# Patient Record
Sex: Male | Born: 1975 | Race: Black or African American | Hispanic: No | Marital: Married | Smoking: Former smoker
Health system: Southern US, Community
[De-identification: ages and names within clinical notes are randomized; demographics above are authoritative.]

## PROBLEM LIST (undated history)

## (undated) DIAGNOSIS — E119 Type 2 diabetes mellitus without complications: Secondary | ICD-10-CM

## (undated) DIAGNOSIS — I10 Essential (primary) hypertension: Secondary | ICD-10-CM

## (undated) DIAGNOSIS — Z973 Presence of spectacles and contact lenses: Secondary | ICD-10-CM

## (undated) DIAGNOSIS — G43909 Migraine, unspecified, not intractable, without status migrainosus: Secondary | ICD-10-CM

## (undated) DIAGNOSIS — E785 Hyperlipidemia, unspecified: Secondary | ICD-10-CM

## (undated) HISTORY — DX: Presence of spectacles and contact lenses: Z97.3

## (undated) HISTORY — DX: Migraine, unspecified, not intractable, without status migrainosus: G43.909

## (undated) HISTORY — DX: Hyperlipidemia, unspecified: E78.5

---

## 2013-01-19 ENCOUNTER — Emergency Department: Payer: Self-pay | Admitting: Emergency Medicine

## 2013-10-13 ENCOUNTER — Emergency Department: Payer: Self-pay | Admitting: Emergency Medicine

## 2013-10-13 LAB — URINALYSIS, COMPLETE
BILIRUBIN, UR: NEGATIVE
BLOOD: NEGATIVE
Bacteria: NONE SEEN
GLUCOSE, UR: NEGATIVE mg/dL (ref 0–75)
Ketone: NEGATIVE
Leukocyte Esterase: NEGATIVE
NITRITE: NEGATIVE
Ph: 6 (ref 4.5–8.0)
Protein: NEGATIVE
RBC,UR: 1 /HPF (ref 0–5)
SQUAMOUS EPITHELIAL: NONE SEEN
Specific Gravity: 1.008 (ref 1.003–1.030)

## 2013-10-13 LAB — GC/CHLAMYDIA PROBE AMP

## 2013-11-20 ENCOUNTER — Ambulatory Visit (INDEPENDENT_AMBULATORY_CARE_PROVIDER_SITE_OTHER): Payer: Managed Care, Other (non HMO) | Admitting: Medical

## 2013-11-20 ENCOUNTER — Encounter: Payer: Self-pay | Admitting: Medical

## 2013-11-20 VITALS — BP 112/80 | HR 80 | Temp 97.8°F | Resp 16 | Ht 71.0 in | Wt 258.0 lb

## 2013-11-20 DIAGNOSIS — Z7251 High risk heterosexual behavior: Secondary | ICD-10-CM

## 2013-11-20 DIAGNOSIS — E669 Obesity, unspecified: Secondary | ICD-10-CM

## 2013-11-20 DIAGNOSIS — R030 Elevated blood-pressure reading, without diagnosis of hypertension: Secondary | ICD-10-CM

## 2013-11-20 NOTE — Progress Notes (Signed)
Subjective Here today as a new patient with concerns of high blood pressure. Went to the emergency department a few weeks ago since a male he was seeing turned up positive with trichomonas, different than his normal girlfriend. At that visit was noted to have elevated blood pressure reading. Since then his current girlfriends mother has checked his blood pressure a few times and down his blood pressure to be as high as 158/100. He has been working to eat healthier less salt less pork. Trying to lose weight. Otherwise has been in normal state of health without complaint.  Review of systems as in subjective   Objective: Filed Vitals:   11/20/13 1152  BP: 112/80  Pulse: 80  Temp: 97.8 F (36.6 C)  Resp: 16    General appearance: alert, no distress, WD/WN, AA male Neck: supple, no lymphadenopathy, no thyromegaly, no masses, no bruits Heart: RRR, normal S1, S2, no murmurs Lungs: CTA bilaterally, no wheezes, rhonchi, or rales Pulses: 2+ symmetric, upper and lower extremities, normal cap refill Ext: no edema   Assessment: Encounter Diagnoses  Name Primary?  . Elevated blood pressure reading without diagnosis of hypertension Yes  . Obesity   . High risk sexual behavior     Plan: BP normal today with my reading and nurse's reading with large cuff.   Discussed diagnosis, possible complications, and treatment of high blood pressure.he will work on diet and exercise changes, weight loss, and recheck in 4 months on weight and blood pressure and for physical  Counseled on safe sex, prevention, although I don't believe he appreciated the conversation.

## 2014-07-06 ENCOUNTER — Emergency Department (HOSPITAL_COMMUNITY)
Admission: EM | Admit: 2014-07-06 | Discharge: 2014-07-06 | Disposition: A | Discharge status: Home / Self Care | Payer: BLUE CROSS/BLUE SHIELD | Attending: Emergency Medicine | Admitting: Emergency Medicine

## 2014-07-06 ENCOUNTER — Encounter (HOSPITAL_COMMUNITY): Payer: Self-pay

## 2014-07-06 DIAGNOSIS — L089 Local infection of the skin and subcutaneous tissue, unspecified: Secondary | ICD-10-CM

## 2014-07-06 DIAGNOSIS — Z720 Tobacco use: Secondary | ICD-10-CM | POA: Diagnosis not present

## 2014-07-06 DIAGNOSIS — L0889 Other specified local infections of the skin and subcutaneous tissue: Secondary | ICD-10-CM | POA: Insufficient documentation

## 2014-07-06 DIAGNOSIS — M25422 Effusion, left elbow: Secondary | ICD-10-CM | POA: Diagnosis present

## 2014-07-06 DIAGNOSIS — R21 Rash and other nonspecific skin eruption: Secondary | ICD-10-CM | POA: Insufficient documentation

## 2014-07-06 DIAGNOSIS — Z8679 Personal history of other diseases of the circulatory system: Secondary | ICD-10-CM | POA: Insufficient documentation

## 2014-07-06 MED ORDER — CEPHALEXIN 500 MG PO CAPS: 500.0000 mg | ORAL_CAPSULE | Freq: Four times a day (QID) | ORAL | Status: DC

## 2014-07-06 MED ORDER — DOXYCYCLINE HYCLATE 100 MG PO CAPS: 100.0000 mg | ORAL_CAPSULE | Freq: Two times a day (BID) | ORAL | Status: DC

## 2014-07-06 NOTE — Discharge Instructions (Signed)

## 2014-07-06 NOTE — ED Provider Notes (Signed)
CSN: [id number]     Arrival date & time 07/06/14  1638 History  This chart was scribed for non-physician provider Alyse Low, PA-C, working with Pamella Pert, MD by Irene Pap, ED Scribe. This patient was seen in room WTR5/WTR5 and patient care was started at 6:09 PM.   Chief Complaint  Patient presents with  . Joint Swelling  . Cellulitis   The history is provided by the patient. No language interpreter was used.    HPI Comments: Eric Mitchell is a 39 y.o. male who presents to the Emergency Department complaining of left elbow swelling and pain onset one day ago. 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 Per nurse's note in Triage, pt rates pain 5/10. He denies knowing if something bit him or scratched him. He denies symptoms like this in the past or allergies.  He denies any known medical problems or use of daily medications.   Past Medical History  Diagnosis Date  . Wears contact lenses   . Migraines    History reviewed. No pertinent past surgical history. History reviewed. No pertinent family history. History  Substance Use Topics  . Smoking status: Current Every Day Smoker -- 0.00 packs/day    Types: Cigarettes  . Smokeless tobacco: Not on file  . Alcohol Use: 0.6 oz/week    1 Cans of beer, 0 Standard drinks or equivalent per week    Review of Systems  Musculoskeletal: Positive for myalgias.  Skin: Positive for rash.       Skin swelling around bump  All other systems reviewed and are negative.   Allergies  Review of patient's allergies indicates no known allergies.  Home Medications   Prior to Admission medications   Not on File   BP 140/102 mmHg  Pulse 98  Temp(Src) 98.3 F (36.8 C) (Oral)  Resp 20  Ht 6' (1.829 m)  Wt 230 lb (104.327 kg)  BMI 31.19 kg/m2  SpO2 97%  Physical Exam  Constitutional: He is oriented to person, place, and time. He appears well-developed and well-nourished. No distress.  HENT:  Head:  Normocephalic and atraumatic.  Eyes: Conjunctivae and EOM are normal.  Neck: Normal range of motion. Neck supple.  Cardiovascular: Normal rate, regular rhythm and normal heart sounds.   Pulmonary/Chest: Effort normal and breath sounds normal.  Musculoskeletal: Normal range of motion. He exhibits no edema.  Neurological: He is alert and oriented to person, place, and time.  Skin: Skin is warm and dry.  14 x 12 cm area of erythema and edema below left posterior elbow.   Psychiatric: He has a normal mood and affect. His behavior is normal.  Nursing note and vitals reviewed.   ED Course  Procedures (including critical care time) DIAGNOSTIC STUDIES: Oxygen Saturation is 97%.    COORDINATION OF CARE: 6:13 PM-Discussed treatment plan which includes anti-biotics and re-check tomorrow with pt at bedside and pt agreed to plan.   Labs Review Labs Reviewed - No data to display  Imaging Review No results found.   EKG Interpretation None      MDM  Area of erythema circled with    Final diagnoses:  Infection of skin    Keflex Doxycycline Return here tomorrow for recheck   I personally performed the services in this documentation, which was scribed in my presence.  The recorded information has been reviewed and considered.   Ronnald Collum.  Hollace [place], PA-C 07/06/14 1827  Pamella Pert, MD  07/06/14 2344 

## 2014-07-06 NOTE — ED Notes (Signed)
Pt c/o L elbow swelling, pain, and possible cellulitis.  Pain score 5/10.  Pt reports a small "bump" yesterday that has increased to the entire posterior elbow.  Swelling and warmth noted.

## 2014-07-09 ENCOUNTER — Ambulatory Visit (INDEPENDENT_AMBULATORY_CARE_PROVIDER_SITE_OTHER): Payer: Worker's Compensation | Admitting: Family Medicine

## 2014-07-09 ENCOUNTER — Encounter: Payer: Self-pay | Admitting: Family Medicine

## 2014-07-09 DIAGNOSIS — M79641 Pain in right hand: Secondary | ICD-10-CM

## 2014-07-09 DIAGNOSIS — S61411A Laceration without foreign body of right hand, initial encounter: Secondary | ICD-10-CM | POA: Diagnosis not present

## 2014-07-09 NOTE — Progress Notes (Signed)
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 The right hand shows a significant laceration over the dorsal aspect of the third and fourth metacarpals. He does feel he can move his fingers normally.   patient is unaware of when he had his last technical shot.   Patient is a coming by his wife. His family consists of 5 children.   Objective: patient's in no acute distress. There were no vitals taken for this visit.   Patient's right hand has a 4 cm dorsal laceration over the metacarpals of the third and fourth fingers. He shows normal range of motion. Gross inspection shows no tendon involvement at the laceration is full-thickness and goes down to the sheath the extensor tendons.  Please see repair noted above. Patient coming back in 7-10 days unless he has increasing pain. Is given a work note for today.  Signed, Carola Frost.D.

## 2014-07-09 NOTE — Progress Notes (Signed)
Procedure: Risk and benefits discussed and verbal consent obtained. The patient was anesthetized using 5 cc of 2% lidocaine with epinephrine.  The wound was scrubbed with soap and water. Sterile prep and drape. The wound was closed with 4-0 Vicryl.  The wound was then cleaned with water and a bandage was applied. Patient instructed to come back for suture removal in 10 days. Philis Fendt, MS, PA-C   12:43 PM, 07/09/2014   Case discussed with Mr. Carlis Abbott who did the repair above.  Signed, Carola Frost.D.

## 2014-07-16 ENCOUNTER — Ambulatory Visit (INDEPENDENT_AMBULATORY_CARE_PROVIDER_SITE_OTHER): Payer: Worker's Compensation | Admitting: Emergency Medicine

## 2014-07-16 VITALS — BP 126/80 | HR 94 | Temp 98.4°F | Resp 18

## 2014-07-16 DIAGNOSIS — S61411D Laceration without foreign body of right hand, subsequent encounter: Secondary | ICD-10-CM

## 2014-07-16 DIAGNOSIS — T8131XA Disruption of external operation (surgical) wound, not elsewhere classified, initial encounter: Secondary | ICD-10-CM

## 2014-07-16 NOTE — Patient Instructions (Signed)

## 2014-07-16 NOTE — Progress Notes (Signed)
Hoyle Crilly Sep 23, 1975 39 y.o.   Chief Complaint  Patient presents with  . Follow-up    rt hand  . Laceration     Date of Injury: 07/09/2014  History of Present Illness:  Presents for evaluation of work-related complaint. He underwent a surgical repair of her laceration by mouth. He says that the intrusive all under tight and fallen out. And is now 1 beginning to separate and his expressed some blood. Denies any evidence of infection fever chills or other complaints he has very little pain   ROS  Review of systems were reviewed and were unremarkable.  No Known Allergies   Current medications reviewed and updated. Past medical history, family history, social history have been reviewed and updated.   Physical Exam  Constitutional: He is oriented to person, place, and time and well-developed, well-nourished, and in no distress.  HENT:  Head: Normocephalic and atraumatic.  Eyes: Pupils are equal, round, and reactive to light.  Neck: Normal range of motion. Neck supple.  Cardiovascular: Normal rate.   Pulmonary/Chest: No respiratory distress.  Abdominal: Soft.  Musculoskeletal: Normal range of motion.  Neurological: He is alert and oriented to person, place, and time.  Skin: Laceration noted.  Psychiatric: Affect normal.     Assessment and Plan:  Dehiscence of a laceration.  The wound was disinfected and then treated with benzoin and Steri-Strips he will follow-up in a week.

## 2014-07-19 ENCOUNTER — Ambulatory Visit (INDEPENDENT_AMBULATORY_CARE_PROVIDER_SITE_OTHER): Payer: Worker's Compensation | Admitting: Emergency Medicine

## 2014-07-19 VITALS — BP 120/78 | HR 91 | Temp 98.2°F | Resp 16 | Ht 71.0 in | Wt 244.5 lb

## 2014-07-19 DIAGNOSIS — S61411D Laceration without foreign body of right hand, subsequent encounter: Secondary | ICD-10-CM | POA: Diagnosis not present

## 2014-07-19 NOTE — Progress Notes (Signed)
Subjective:  Patient ID: Eric Mitchell, male    DOB: 1976-01-17  Age: 39 y.o. MRN: [id number]  CC: Follow-up   HPI Eric Mitchell presents  for suture removal. She had a laceration of Eric Mitchell hand 10 days ago that was complicated by a partial dehiscence of wound with suture unraveling. Eric Mitchell denies any complications now.  History Eric Mitchell has a past medical history of Wears contact lenses and Migraines.   Eric Mitchell has no past surgical history on file.   Eric Mitchell  family history is not on file.  Eric Mitchell   reports that Eric Mitchell has been smoking Cigarettes.  Eric Mitchell has been smoking about 0.00 packs per day. Eric Mitchell does not have any smokeless tobacco history on file. Eric Mitchell reports that Eric Mitchell drinks about 0.6 oz of alcohol per week. Eric Mitchell reports that Eric Mitchell does not use illicit drugs.  Outpatient Prescriptions Prior to Visit  Medication Sig Dispense Refill  . cephALEXin (KEFLEX) 500 MG capsule Take 500 mg by mouth 4 (four) times daily.    Marland Kitchen doxycycline (VIBRA-TABS) 100 MG tablet Take 100 mg by mouth 2 (two) times daily.     No facility-administered medications prior to visit.    History   Social History  . Marital Status: Unknown    Spouse Name: N/A  . Number of Children: N/A  . Years of Education: N/A   Social History Main Topics  . Smoking status: Current Every Day Smoker -- 0.00 packs/day    Types: Cigarettes  . Smokeless tobacco: Not on file  . Alcohol Use: 0.6 oz/week    1 Cans of beer, 0 Standard drinks or equivalent per week  . Drug Use: No  . Sexual Activity: Not on file   Other Topics Concern  . None   Social History Narrative     Review of Systems  Constitutional: Negative for fever, chills and appetite change.  HENT: Negative for congestion, ear pain, postnasal drip, sinus pressure and sore throat.   Eyes: Negative for pain and redness.  Respiratory: Negative for cough, shortness of breath and wheezing.   Cardiovascular: Negative for leg swelling.  Gastrointestinal: Negative for nausea, vomiting,  abdominal pain, diarrhea, constipation and blood in stool.  Endocrine: Negative for polyuria.  Genitourinary: Negative for dysuria, urgency, frequency and flank pain.  Musculoskeletal: Negative for gait problem.  Skin: Negative for rash.  Neurological: Negative for weakness and headaches.  Psychiatric/Behavioral: Negative for confusion and decreased concentration. The patient is not nervous/anxious.     Objective:  BP 120/78 mmHg  Pulse 91  Temp(Src) 98.2 F (36.8 C) (Oral)  Resp 16  Ht 5\' 11"  (1.803 m)  Wt 244 lb 8 oz (110.904 kg)  BMI 34.12 kg/m2  SpO2 98%  Physical Exam  Constitutional: Eric Mitchell is oriented to person, place, and time. Eric Mitchell appears well-developed and well-nourished.  HENT:  Head: Normocephalic and atraumatic.  Eyes: Conjunctivae are normal. Pupils are equal, round, and reactive to light.  Pulmonary/Chest: Effort normal.  Musculoskeletal: Eric Mitchell exhibits no edema.  Neurological: Eric Mitchell is alert and oriented to person, place, and time.  Skin: Skin is dry.  Psychiatric: Eric Mitchell has a normal mood and affect. Eric Mitchell behavior is normal. Thought content normal.      Assessment & Plan:   Eric Mitchell was seen today for follow-up.  Diagnoses and all orders for this visit:  Laceration of hand, right, subsequent encounter   I am having Eric Mitchell maintain Eric Mitchell cephALEXin and doxycycline.  No orders of the defined types were placed in this encounter.  Eric Mitchell sutures were all removed.  Appropriate red flag conditions were discussed with the patient as well as actions that should be taken.  Patient expressed Eric Mitchell understanding.  Follow-up: Return if symptoms worsen or fail to improve.  Roselee Culver, MD

## 2014-11-11 ENCOUNTER — Encounter (HOSPITAL_COMMUNITY): Payer: Self-pay | Admitting: Oncology

## 2014-11-11 ENCOUNTER — Emergency Department (HOSPITAL_COMMUNITY): Payer: BLUE CROSS/BLUE SHIELD

## 2014-11-11 ENCOUNTER — Emergency Department (HOSPITAL_COMMUNITY)
Admission: EM | Admit: 2014-11-11 | Discharge: 2014-11-11 | Disposition: A | Discharge status: Home / Self Care | Payer: BLUE CROSS/BLUE SHIELD | Attending: Emergency Medicine | Admitting: Emergency Medicine

## 2014-11-11 DIAGNOSIS — Y998 Other external cause status: Secondary | ICD-10-CM | POA: Insufficient documentation

## 2014-11-11 DIAGNOSIS — W19XXXA Unspecified fall, initial encounter: Secondary | ICD-10-CM

## 2014-11-11 DIAGNOSIS — Z720 Tobacco use: Secondary | ICD-10-CM | POA: Insufficient documentation

## 2014-11-11 DIAGNOSIS — S20211A Contusion of right front wall of thorax, initial encounter: Secondary | ICD-10-CM | POA: Insufficient documentation

## 2014-11-11 DIAGNOSIS — Y92480 Sidewalk as the place of occurrence of the external cause: Secondary | ICD-10-CM | POA: Insufficient documentation

## 2014-11-11 DIAGNOSIS — Z792 Long term (current) use of antibiotics: Secondary | ICD-10-CM | POA: Insufficient documentation

## 2014-11-11 DIAGNOSIS — Z973 Presence of spectacles and contact lenses: Secondary | ICD-10-CM | POA: Insufficient documentation

## 2014-11-11 DIAGNOSIS — Y9389 Activity, other specified: Secondary | ICD-10-CM | POA: Insufficient documentation

## 2014-11-11 DIAGNOSIS — W010XXA Fall on same level from slipping, tripping and stumbling without subsequent striking against object, initial encounter: Secondary | ICD-10-CM | POA: Insufficient documentation

## 2014-11-11 DIAGNOSIS — Y9289 Other specified places as the place of occurrence of the external cause: Secondary | ICD-10-CM | POA: Insufficient documentation

## 2014-11-11 DIAGNOSIS — Z8679 Personal history of other diseases of the circulatory system: Secondary | ICD-10-CM | POA: Insufficient documentation

## 2014-11-11 MED ORDER — HYDROCODONE-ACETAMINOPHEN 5-325 MG PO TABS
1.0000 | ORAL_TABLET | Freq: Once | ORAL | Status: AC
  Administered 2014-11-11: 1 via ORAL
  Filled 2014-11-11: qty 1

## 2014-11-11 NOTE — ED Provider Notes (Signed)
CSN: [id number]     Arrival date & time 11/11/14  0610 History   First MD Initiated Contact with Patient 11/11/14 [phone]     Chief Complaint  Patient presents with  . Rib Injury     (Consider location/radiation/quality/duration/timing/severity/associated sxs/prior Treatment) HPI Comments: Pt is a 39 yo male who presents to the ED with complaint of right rib pain. Pt reports he slipped in the rain and fell on his right side onto a concrete sidewalk on 10/8. Denies LOC or head injury. Pt reports having worsening intermittent sharp pain to his right lower ribs that is worse with movement of taking a deep breath. Denies fever, chills, HA, sore throat, cough, SOB, wheezing, CP, abdominal pain, N/V/D, urinary sxs, numbness, tingling, weakness. Pt's significant other reports noticing a bruise to the area last week. Pt has not tried taking anything at home for pain relief.    Past Medical History  Diagnosis Date  . Wears contact lenses   . Migraines    History reviewed. No pertinent past surgical history. No family history on file. Social History  Substance Use Topics  . Smoking status: Current Every Day Smoker -- 0.00 packs/day    Types: Cigarettes  . Smokeless tobacco: Never Used  . Alcohol Use: 0.6 oz/week    1 Cans of beer, 0 Standard drinks or equivalent per week    Review of Systems  Cardiovascular: Positive for chest pain.  All other systems reviewed and are negative.     Allergies  Review of patient's allergies indicates no known allergies.  Home Medications   Prior to Admission medications   Medication Sig Start Date End Date Taking? Authorizing Provider  cephALEXin (KEFLEX) 500 MG capsule Take 500 mg by mouth 4 (four) times daily.    Historical Provider, MD  doxycycline (VIBRA-TABS) 100 MG tablet Take 100 mg by mouth 2 (two) times daily.    Historical Provider, MD   BP 144/91 mmHg  Pulse 69  Temp(Src) 97.9 F (36.6 C) (Oral)  Resp 20  Ht 5\' 11"  (1.803 m)  Wt 220  lb (99.791 kg)  BMI 30.70 kg/m2  SpO2 99% Physical Exam  Constitutional: He is oriented to person, place, and time. He appears well-developed and well-nourished. No distress.  HENT:  Head: Normocephalic and atraumatic.  Mouth/Throat: Oropharynx is clear and moist. No oropharyngeal exudate.  Eyes: Conjunctivae and EOM are normal. Right eye exhibits no discharge. Left eye exhibits no discharge. No scleral icterus.  Neck: Normal range of motion. Neck supple.  Cardiovascular: Normal rate, regular rhythm, normal heart sounds and intact distal pulses.   Pulmonary/Chest: Effort normal and breath sounds normal. No respiratory distress. He has no wheezes. He has no rales. He exhibits tenderness.    Abdominal: Soft. Bowel sounds are normal. He exhibits no distension and no mass. There is no tenderness. There is no rebound and no guarding.  Musculoskeletal: Normal range of motion. He exhibits no edema or tenderness.  Neurological: He is alert and oriented to person, place, and time.  Skin: Skin is warm and dry. He is not diaphoretic.  Nursing note and vitals reviewed.   ED Course  Procedures (including critical care time) Labs Review Labs Reviewed - No data to display  Imaging Review No results found. I have personally reviewed and evaluated these images and lab results as part of my medical decision-making.   EKG Interpretation   Date/Time:  Sunday November 11 2014 06:27:50 EDT Ventricular Rate:  69 PR Interval:  140 QRS  Duration: 87 QT Interval:  401 QTC Calculation: 430 R Axis:   59 Text Interpretation:  Sinus rhythm Confirmed by HORTON  MD, COURTNEY  ([id number]) on 11/11/2014 6:30:41 AM     Filed Vitals:   11/11/14 0741  BP: 110/70  Pulse: 62  Temp: 97.9 F (36.6 C)  Resp: 17    MDM   Final diagnoses:  Rib contusion, right, initial encounter  Fall, initial encounter    Pt presents s/p fall that occurred on 10/8 with worsening right lower rib pain. Denies LOC or head  injury. Pain worse with movement/deep breathing. VSS. Exam revealed TTP at right lower anterior ribs, no swelling, no ecchymosis, no abrasion. Cardiac exam benign, lungs CTAB.  EKG shows NSR. CXR shows no acute abnormalities. I suspect sxs are likely due to rib contusion associated with pt's fall. I do not feel that any further workup or imaging is warranted at this time. Plan to d/c pt home. Advised pt to use ibuprofen for pain control. Resource guide given to pt for PCP follow up.  Evaluation does not show pathology requring ongoing emergent intervention or admission. Pt is hemodynamically stable and mentating appropriately. Discussed findings/results and plan with patient/guardian, who agrees with plan. All questions answered. Return precautions discussed and outpatient follow up given.      Chesley Noon [place], [place] 11/11/14 2482  Merryl Hacker, MD 11/11/14 [phone]

## 2014-11-11 NOTE — ED Notes (Signed)
Pt from home had a fall on 10/8 and has had right sided rib/abdominal pain since then that has spread to his epigastric region. Pain is 3/10 while not moving and 10/10 when he moves. Pt states pain is sharp/ Pt has normal heart, lung, and abdominal sounds. Pt has no abdominal tenderness.

## 2014-11-11 NOTE — Discharge Instructions (Signed)
Take Ibuprofen as prescribed over the counter of pain relief. Please follow up with a primary care provider from the Resource Guide provided below in 4-5 days. Please return to the Emergency Department if symptoms worsen or new onset of fever, cough, difficulty breathing.   Emergency Department Resource Guide 1) Find a Doctor and Pay Out of Pocket Although you won't have to find out who is covered by your insurance plan, it is a good idea to ask around and get recommendations. You will then need to call the office and see if the doctor you have chosen will accept you as a new patient and what types of options they offer for patients who are self-pay. Some doctors offer discounts or will set up payment plans for their patients who do not have insurance, but you will need to ask so you aren't surprised when you get to your appointment.  2) Contact Your Local Health Department Not all health departments have doctors that can see patients for sick visits, but many do, so it is worth a call to see if yours does. If you don't know where your local health department is, you can check in your phone book. The CDC also has a tool to help you locate your state's health department, and many state websites also have listings of all of their local health departments.  3) Find a [facility] If your illness is not likely to be very severe or complicated, you may want to try a walk in clinic. These are popping up all over the country in pharmacies, drugstores, and shopping centers. They're usually staffed by nurse practitioners or physician assistants that have been trained to treat common illnesses and complaints. They're usually fairly quick and inexpensive. However, if you have serious medical issues or chronic medical problems, these are probably not your best option.  No Primary Care Doctor: - Call Health Connect at  [phone] - they can help you locate a primary care doctor that  accepts your insurance,  provides certain services, etc. - Physician Referral Service- [phone]  Chronic Pain Problems: Organization         Address  Phone   Notes  [facility]  [phone] Patients need to be referred by their primary care doctor.   Medication Assistance: Organization         Address  Phone   Notes  [facility] Medication [facility] [address] [place], [place] [address] [phone] --Must be a resident of [facility] -- Must have NO insurance coverage whatsoever (no Medicaid/ Medicare, etc.) -- The pt. MUST have a primary care doctor that directs their care regularly and follows them in the community   MedAssist  [phone]   [employer]  [phone]    Agencies that provide inexpensive medical care: Organization         Address  Phone   Notes  [place]  [phone]   [facility]    [phone]   [facility] [place], [place] [address] [phone]   [place] [address] [place] [phone]   Planned Parenthood    [phone]   [facility]    [phone]   [place] and [place], [place] Phone:  [phone], Fax:  [phone] Hours of Operation:  9 am - 6 pm, M-F.  Also accepts Medicaid/Medicare and self-pay.  [facility] for Tabiona [place], [address], [place] Phone: [phone], Fax: [phone]. Hours of Operation:  8:30 am - 5:30 pm, M-F.  Also accepts Medicaid and self-pay.  [facility] [place] [address], [place] Phone: [phone]   [place], [place], [place] [phone], Ext. [phone] Mondays & Thursdays: 7-9 AM.  First 15 patients are seen on a first come, first serve basis.    [place] Providers:  Organization         Address  Phone    Notes  [facility] [address], [place] [phone] Also accepts self-pay patients.  [facility] [address], [place]  [phone]   [place], [address], [place] [phone]   [facility] [address] [place] [phone]   Lucianne Lei [address], [place]   [phone] Only accepts [place] Access [place] patients after they have their name applied to their card.   Self-Pay (no insurance) in [facility]:  Organization         Address  Phone   Notes  Sickle Cell Patients, [facility] [place] [phone]   [facility] [place] [phone]   [facility] [place]  [place], [place], [place] [phone]   [facility]/Dr. Osei-Bonsu  [address] [place] or [address], [place] [phone] Phone number for both [place] and [place] locations is the same.  [facility] [address] [place] [phone]   [facility] [address] [place] or [address] [phone]   [facility] [address] [place] [phone], phone; [phone], fax Sees patients 1st and 3rd Saturday of every month.  Must not qualify for public or private insurance (i.e. Medicaid, Medicare, [place] Health Choice, Veterans' Benefits)  Household income should be no more than 200% of the poverty level The clinic cannot treat you if you are pregnant or think you are pregnant  Sexually transmitted diseases are not treated at the clinic.    Dental Care: Organization         Address  Phone  Notes  [facility] [place] [place] [phone] Accepts children up to age 48 who are enrolled in [place] or [place]; pregnant women with a Medicaid card; and children who have applied for Medicaid or [place] Health Choice, but were declined, whose parents can pay a reduced fee at time of service.  [facility]  [address], [place] [phone] Accepts children up to age 30 who are enrolled in [place] or [place]; pregnant women with a Medicaid card; and children who have applied for Medicaid or [place] Health Choice, but were declined, whose parents can pay a reduced fee at time of service.  [facility] PRO[facility]  [place] [phone] Patients are seen by appointment only. Walk-ins are not accepted. [place] will see patients 85 years of age and older. Monday - Tuesday (  8am-5pm) Most Wednesdays (8:30-5pm) $30 per visit, cash only  [facility] Adult Dental Access PROGRAM  [address], [facility] [phone] Patients are seen by appointment only. Walk-ins are not accepted. [place]iona will see patients 76 years of age and older. One Wednesday Evening (Monthly: Volunteer Based).  $30 per visit, cash only  [place]  [phone] for adults; Children under age 85, call Graduate Pediatric Dentistry at [phone]. Children aged 73-14, please call [phone] to request a pediatric application.  Dental services are provided in all areas of dental care including fillings, crowns and bridges, complete and partial dentures, implants, gum treatment, root canals, and extractions. Preventive care is also provided. Treatment is provided to both adults and children. Patients are selected via a lottery and there is often a waiting list.   [facility] [address], [place]  [phone] [email or web address]   [facility] [address] [place], [place] [phone], Ext.  123 Second and Fourth Thursday of each month, opens at 6:30 AM; Clinic ends at 9 AM.  Patients are seen on a first-come first-served basis, and a limited number are seen during each clinic.   [facility]  [address] [place]ard D[place]er [place], [place] [phone]   Eligibility Requirements You must have lived in [place], [place], or [place] counties for at least the last three months.   You cannot be eligible for state or federal sponsored [employer], including [employer], [place], or [employer].   You generally cannot be eligible for healthcare insurance through your employer.    How to apply: Eligibility screenings are held every Tuesday and Wednesday afternoon from 1:00 pm until 4:00 pm. You do not need an appointment for the interview!  [facility] [address], [place], [place]   [place]  [place] Department  [facility]  [phone]    Behavioral Health Resources in the Community: Intensive Outpatient Programs Organization         Address  Phone  Notes  [place]. [address], [place], [place] [phone]   [facility] Outpatient [address] [place], [place]   ADS: Alcohol & Drug Svcs [address] [place], [place]   [place] [address]  [place], [place] or [phone]   Substance Abuse Resources Organization         Address  Phone  Notes  Alcohol and Drug Services  [phone]   [place]  [phone]   [facility] [place]   Chinita Pester  [phone]   Residential & Outpatient Substance Abuse Program  [phone]   Psychological Services Organization         Address  Phone  Notes  [facility] [place]  [place]  [phone]   [place] [address], [place] or [phone]    Mobile Crisis Teams Organization         Address  Phone  Notes  [facility], Mobile Crisis Care Unit  [phone]   [facility] Psychotherapeutic Services  [address] [place], [place]   [place] Levels [address] [place] [phone]    Self-Help/Support Groups Organization         Address  Phone  Notes  [facility]ental [facility]oc[facility] of [place] - variety of support groups  [place] Call for more information  Narcotics Anonymous (NA), [facility] [address], [employer] [place]  2 meetings at this location   Special educational needs teacher         Address  Phone  Notes  [facility] [place],    [place]  [phone]   [facility]  [address], [place] [phone], [place], [place]   [place], [place] [phone] Admissions: 8am-3pm M-F  Incentives Substance [place] [address],    [place], [place] [phone]   [facility] [address] [place], [place], [place]   [facility] [address],  [place], [place]   [facility] Programs - Intensive Outpatient [address]een [address], [place], [place]   [facility] ([place] [address].,  [place], [place] [phone] or [phone]   [facility]idential Treatment Services (RTS) [address] [place], [place] Accepts Medicaid  [facility] [place] [address]  [place] [phone] Substance Abuse/Addiction Treatment   [facility] Organization         Address  Phone  Notes  [facility]  [phone]   Domenic Schwab, PhD [address] Arlis [place], [place]   [phone] or [phone]   [place]  [place], [place] [phone]   [facility] [address] [place], [place] [phone] Insurance/Medicaid/sponsorship through [facility] and Families [address], [address] [place]                                    [place], [place] [phone] [place] [address] [address], [place] [phone]    Dr. Adele Schilder  [phone]   [facility] Dept. 1) [address] [place] 2) [place] 3)  [address], [place] [phone]  [phone]   [place] [phone] or [phone] (After Hours)

## 2014-11-11 NOTE — ED Notes (Signed)
Pt to xray

## 2014-11-11 NOTE — ED Notes (Signed)
Pt presents w/ c/o epigastric and chest pain.  Pt rates pain 10/10 sharp in nature.  Pt fell last week onto his right side on cement this is when the pain began and has gotten progressively worse.

## 2015-03-15 ENCOUNTER — Emergency Department (HOSPITAL_COMMUNITY)
Admission: EM | Admit: 2015-03-15 | Discharge: 2015-03-15 | Disposition: A | Discharge status: Home / Self Care | Payer: BLUE CROSS/BLUE SHIELD | Attending: Emergency Medicine | Admitting: Emergency Medicine

## 2015-03-15 ENCOUNTER — Encounter (HOSPITAL_COMMUNITY): Payer: Self-pay | Admitting: Emergency Medicine

## 2015-03-15 DIAGNOSIS — H1012 Acute atopic conjunctivitis, left eye: Secondary | ICD-10-CM | POA: Insufficient documentation

## 2015-03-15 DIAGNOSIS — Z8679 Personal history of other diseases of the circulatory system: Secondary | ICD-10-CM | POA: Diagnosis not present

## 2015-03-15 DIAGNOSIS — F17210 Nicotine dependence, cigarettes, uncomplicated: Secondary | ICD-10-CM | POA: Insufficient documentation

## 2015-03-15 DIAGNOSIS — H5712 Ocular pain, left eye: Secondary | ICD-10-CM | POA: Diagnosis present

## 2015-03-15 DIAGNOSIS — H11422 Conjunctival edema, left eye: Secondary | ICD-10-CM | POA: Insufficient documentation

## 2015-03-15 MED ORDER — FLUORESCEIN SODIUM 1 MG OP STRP
1.0000 | ORAL_STRIP | Freq: Once | OPHTHALMIC | Status: AC
  Administered 2015-03-15: 1 via OPHTHALMIC
  Filled 2015-03-15: qty 1

## 2015-03-15 MED ORDER — NAPHAZOLINE-PHENIRAMINE 0.025-0.3 % OP SOLN: 1.0000 [drp] | Freq: Four times a day (QID) | OPHTHALMIC | Status: DC | PRN

## 2015-03-15 MED ORDER — TETRACAINE HCL 0.5 % OP SOLN
2.0000 [drp] | Freq: Once | OPHTHALMIC | Status: AC
  Administered 2015-03-15: 2 [drp] via OPHTHALMIC
  Filled 2015-03-15: qty 4

## 2015-03-15 NOTE — Discharge Instructions (Signed)
Take your medications as prescribed. Refrain from using your contacts for the next few days until your symptoms have resolved. Follow up with your Eye doctor in the next 1-2 days as needed. Please return to the Emergency Department if symptoms worsen or new onset of fever, redness, swelling, drainage, visual changes, sensitivity to light.

## 2015-03-15 NOTE — ED Notes (Signed)
Pt states he woke up this morning with his left eye feeling irritated and draining  Pt states it itches and has been draining

## 2015-03-15 NOTE — ED Provider Notes (Signed)
CSN: [id number]     Arrival date & time 03/15/15  0531 History   First MD Initiated Contact with Patient 03/15/15 0615     Chief Complaint  Patient presents with  . Conjunctivitis     (Consider location/radiation/quality/duration/timing/severity/associated sxs/prior Treatment) HPI   Pt is a 40 yo male with no PMH who presents to the ED with complaint of left eye pain. Pt reports when he woke up this morning he noticed he was having watering to his left eye with associated itching/scratching pain. Endorses wearing contacts. Denies fever, redness, swelling, visual changes, photophobia. Denies any recent eye injury/trauma. Denies using any medications PTA.  Past Medical History  Diagnosis Date  . Wears contact lenses   . Migraines    History reviewed. No pertinent past surgical history. History reviewed. No pertinent family history. Social History  Substance Use Topics  . Smoking status: Current Every Day Smoker -- 0.00 packs/day    Types: Cigarettes  . Smokeless tobacco: Never Used  . Alcohol Use: No    Review of Systems  Constitutional: Negative for fever.  HENT: Negative for congestion, ear pain and rhinorrhea.   Eyes: Positive for discharge (watery) and itching. Negative for photophobia, redness and visual disturbance.  Neurological: Negative for headaches.      Allergies  Review of patient's allergies indicates no known allergies.  Home Medications   Prior to Admission medications   Medication Sig Start Date End Date Taking? Authorizing Provider  naphazoline-pheniramine (NAPHCON-A) 0.025-0.3 % ophthalmic solution Place 1 drop into the left eye 4 (four) times daily as needed for irritation. 03/15/15   Chesley Noon Raahil Ong, PA-C   BP 149/95 mmHg  Pulse 81  Temp(Src) 97.9 F (36.6 C) (Oral)  Resp 16  SpO2 96% Physical Exam  Constitutional: He is oriented to person, place, and time. He appears well-developed and well-nourished.  HENT:  Head: Normocephalic and  atraumatic.  Eyes: Conjunctivae, EOM and lids are normal. Pupils are equal, round, and reactive to light. Lids are everted and swept, no foreign bodies found. Right eye exhibits no chemosis, no discharge, no exudate and no hordeolum. No foreign body present in the right eye. Left eye exhibits chemosis. Left eye exhibits no discharge, no exudate and no hordeolum. No foreign body present in the left eye. Right conjunctiva is not injected. Left conjunctiva is not injected. No scleral icterus.  Slit lamp exam:      The left eye shows no corneal abrasion, no corneal flare, no corneal ulcer, no foreign body, no hyphema and no fluorescein uptake.  Neck: Normal range of motion. Neck supple.  Cardiovascular: Normal rate.   Pulmonary/Chest: Effort normal.  Musculoskeletal: Normal range of motion.  Neurological: He is alert and oriented to person, place, and time.  Skin: Skin is warm and dry.  Nursing note and vitals reviewed.   ED Course  Procedures (including critical care time) Labs Review Labs Reviewed - No data to display  Imaging Review No results found. I have personally reviewed and evaluated these images and lab results as part of my medical decision-making.   EKG Interpretation None      MDM   Final diagnoses:  Allergic conjunctivitis, left    Pt presents with left eye watering and itching. Exam revealed watering of left eye with mild chemosis, remaining exam unremarkable. Visual acuity unremarkable. No fluorescein uptake. Sxs consistent with allergic conjunctivitis. Will d/c pt home with naphazoline eye drops and advised pt to follow up with his ophthalmologist.   Evaluation  does not show pathology requring ongoing emergent intervention or admission. Pt is hemodynamically stable and mentating appropriately. Discussed findings/results and plan with patient/guardian, who agrees with plan. All questions answered. Return precautions discussed and outpatient follow up given.       Chesley Noon [place], [place] 03/15/15 [id number]  Virgel Manifold, MD 03/21/15 [phone]

## 2016-07-28 ENCOUNTER — Encounter (HOSPITAL_BASED_OUTPATIENT_CLINIC_OR_DEPARTMENT_OTHER): Payer: Self-pay

## 2016-07-28 ENCOUNTER — Emergency Department (HOSPITAL_BASED_OUTPATIENT_CLINIC_OR_DEPARTMENT_OTHER)
Admission: EM | Admit: 2016-07-28 | Discharge: 2016-07-28 | Disposition: A | Discharge status: Home / Self Care | Payer: Managed Care, Other (non HMO) | Attending: Emergency Medicine | Admitting: Emergency Medicine

## 2016-07-28 DIAGNOSIS — Z5321 Procedure and treatment not carried out due to patient leaving prior to being seen by health care provider: Secondary | ICD-10-CM | POA: Diagnosis not present

## 2016-07-28 DIAGNOSIS — W57XXXA Bitten or stung by nonvenomous insect and other nonvenomous arthropods, initial encounter: Secondary | ICD-10-CM | POA: Insufficient documentation

## 2016-07-28 DIAGNOSIS — R21 Rash and other nonspecific skin eruption: Secondary | ICD-10-CM | POA: Diagnosis not present

## 2016-07-28 NOTE — ED Notes (Signed)
Pt was not in room when PA went to see pt.

## 2016-07-28 NOTE — ED Notes (Signed)
ED Provider at bedside. 

## 2016-07-28 NOTE — ED Provider Notes (Cosign Needed)
Patient left before evaluation.    Eric Mitchell, [place] 07/28/16 2238

## 2016-07-28 NOTE — ED Notes (Signed)
Pt not found in room when PA went to see pt.  Not found in ED or WR.

## 2016-07-28 NOTE — ED Triage Notes (Signed)
C/o spider bite to right lower abd x 15 min PTA-NAD-steady gait

## 2018-09-16 ENCOUNTER — Encounter: Payer: Self-pay | Admitting: Emergency Medicine

## 2018-09-16 ENCOUNTER — Other Ambulatory Visit: Payer: Self-pay

## 2018-09-16 ENCOUNTER — Emergency Department: Payer: Managed Care, Other (non HMO)

## 2018-09-16 ENCOUNTER — Emergency Department
Admission: EM | Admit: 2018-09-16 | Discharge: 2018-09-16 | Disposition: A | Discharge status: Home / Self Care | Payer: Managed Care, Other (non HMO) | Attending: Emergency Medicine | Admitting: Emergency Medicine

## 2018-09-16 ENCOUNTER — Other Ambulatory Visit
Admission: RE | Admit: 2018-09-16 | Discharge: 2018-09-16 | Disposition: A | Discharge status: Home / Self Care | Payer: Managed Care, Other (non HMO) | Source: Ambulatory Visit | Attending: Emergency Medicine | Admitting: Emergency Medicine

## 2018-09-16 DIAGNOSIS — N202 Calculus of kidney with calculus of ureter: Secondary | ICD-10-CM | POA: Diagnosis not present

## 2018-09-16 DIAGNOSIS — F17210 Nicotine dependence, cigarettes, uncomplicated: Secondary | ICD-10-CM | POA: Diagnosis not present

## 2018-09-16 DIAGNOSIS — I10 Essential (primary) hypertension: Secondary | ICD-10-CM | POA: Diagnosis not present

## 2018-09-16 DIAGNOSIS — N200 Calculus of kidney: Secondary | ICD-10-CM

## 2018-09-16 DIAGNOSIS — R1031 Right lower quadrant pain: Secondary | ICD-10-CM | POA: Diagnosis present

## 2018-09-16 HISTORY — DX: Essential (primary) hypertension: I10

## 2018-09-16 LAB — COMPREHENSIVE METABOLIC PANEL
ALT: 42 U/L (ref 0–44)
AST: 24 U/L (ref 15–41)
Albumin: 4.4 g/dL (ref 3.5–5.0)
Alkaline Phosphatase: 68 U/L (ref 38–126)
Anion gap: 11 (ref 5–15)
BUN: 15 mg/dL (ref 6–20)
CO2: 24 mmol/L (ref 22–32)
Calcium: 9.2 mg/dL (ref 8.9–10.3)
Chloride: 105 mmol/L (ref 98–111)
Creatinine, Ser: 1.09 mg/dL (ref 0.61–1.24)
GFR calc Af Amer: >60 mL/min (ref >60)
GFR calc non Af Amer: >60 mL/min (ref >60)
Glucose, Bld: 169 mg/dL — ABNORMAL HIGH (ref 70–99)
Potassium: 4 mmol/L (ref 3.5–5.1)
Sodium: 140 mmol/L (ref 135–145)
Total Bilirubin: 0.6 mg/dL (ref 0.3–1.2)
Total Protein: 7.4 g/dL (ref 6.5–8.1)

## 2018-09-16 LAB — URINALYSIS, COMPLETE (UACMP) WITH MICROSCOPIC
Bacteria, UA: NONE SEEN
Bilirubin Urine: NEGATIVE
Glucose, UA: >=500 mg/dL — AB
Ketones, ur: NEGATIVE mg/dL
Leukocytes,Ua: NEGATIVE
Nitrite: NEGATIVE
Protein, ur: NEGATIVE mg/dL
RBC / HPF: >50 RBC/hpf — ABNORMAL HIGH (ref 0–5)
Specific Gravity, Urine: 1.018 (ref 1.005–1.030)
Squamous Epithelial / LPF: NONE SEEN (ref 0–5)
pH: 5 (ref 5.0–8.0)

## 2018-09-16 LAB — CBC
HCT: 46.1 % (ref 39.0–52.0)
Hemoglobin: 15.9 g/dL (ref 13.0–17.0)
MCH: 31.5 pg (ref 26.0–34.0)
MCHC: 34.5 g/dL (ref 30.0–36.0)
MCV: 91.3 fL (ref 80.0–100.0)
Platelets: 280 10*3/uL (ref 150–400)
RBC: 5.05 MIL/uL (ref 4.22–5.81)
RDW: 12.3 % (ref 11.5–15.5)
WBC: 5.3 10*3/uL (ref 4.0–10.5)
nRBC: 0 % (ref 0.0–0.2)

## 2018-09-16 LAB — LIPASE, BLOOD: Lipase: 52 U/L — ABNORMAL HIGH (ref 11–51)

## 2018-09-16 MED ORDER — ONDANSETRON HCL 4 MG/2ML IJ SOLN
INTRAMUSCULAR | Status: AC
  Administered 2018-09-16: 4 mg via INTRAVENOUS
  Filled 2018-09-16: qty 2

## 2018-09-16 MED ORDER — TAMSULOSIN HCL 0.4 MG PO CAPS: 0.4000 mg | ORAL_CAPSULE | Freq: Every day | ORAL | 0 refills | Status: AC

## 2018-09-16 MED ORDER — MORPHINE SULFATE (PF) 4 MG/ML IV SOLN
4.0000 mg | Freq: Once | INTRAVENOUS | Status: AC
  Administered 2018-09-16: 06:00:00 4 mg via INTRAVENOUS

## 2018-09-16 MED ORDER — SODIUM CHLORIDE 0.9 % IV BOLUS
1000.0000 mL | Freq: Once | INTRAVENOUS | Status: AC
  Administered 2018-09-16: 1000 mL via INTRAVENOUS

## 2018-09-16 MED ORDER — MORPHINE SULFATE (PF) 4 MG/ML IV SOLN
INTRAVENOUS | Status: AC
  Administered 2018-09-16: 4 mg via INTRAVENOUS
  Filled 2018-09-16: qty 1

## 2018-09-16 MED ORDER — OXYCODONE-ACETAMINOPHEN 5-325 MG PO TABS: 1.0000 | ORAL_TABLET | ORAL | 0 refills | Status: AC | PRN

## 2018-09-16 MED ORDER — HYDROMORPHONE HCL 1 MG/ML IJ SOLN
1.0000 mg | Freq: Once | INTRAMUSCULAR | Status: AC
  Administered 2018-09-16: 1 mg via INTRAVENOUS
  Filled 2018-09-16: qty 1

## 2018-09-16 MED ORDER — ONDANSETRON HCL 4 MG/2ML IJ SOLN
4.0000 mg | Freq: Once | INTRAMUSCULAR | Status: AC
  Administered 2018-09-16: 06:00:00 4 mg via INTRAVENOUS

## 2018-09-16 MED ORDER — KETOROLAC TROMETHAMINE 30 MG/ML IJ SOLN
30.0000 mg | Freq: Once | INTRAMUSCULAR | Status: AC
  Administered 2018-09-16: 30 mg via INTRAVENOUS
  Filled 2018-09-16: qty 1

## 2018-09-16 MED ORDER — ONDANSETRON 4 MG PO TBDP: 4.0000 mg | ORAL_TABLET | Freq: Three times a day (TID) | ORAL | 0 refills | Status: DC | PRN

## 2018-09-16 MED ORDER — SODIUM CHLORIDE 0.9% FLUSH: 3.0000 mL | Freq: Once | INTRAVENOUS | Status: DC

## 2018-09-16 NOTE — ED Notes (Signed)
Pt returned to ED Rm 12 from CT at this time.

## 2018-09-16 NOTE — ED Provider Notes (Addendum)
Patient had been signed out initially as a likely renal stone, with urinalysis pending. On my assumption of care at 7 AM, however, pt had been discharged prior to receiving UA results. I was unable to assess the pt. I've asked nursing to contact the pt to notify him to return for UA. Risks of undiagnosed infection including sepsis, death discussed. See Dr. Saul Fordyce note for details.  ADDENDUM: I was unable to directly reach pt but RN was able. See her note. Pt is aware of need for urine and risk of possible infection. Unlikely based on labs but he understands cannot be diagnosed w/o urine. Advised to return and precautions given.   Duffy Bruce, MD 09/16/18 [phone]

## 2018-09-16 NOTE — ED Notes (Signed)
Post discharge discussed patient with Dr. Ellender Hose.  Patient was discharged prior to getting a needed urine sample.  Patient called by this RN and requested to come back to the hospital to give a urine sample.  Patient states he does not have a ride at this time but will try to come to the ED later.

## 2018-09-16 NOTE — ED Triage Notes (Signed)
Pt presents to ED with right lower abd pain that started approx 30 min ago. Pt states he had similar pain about 2 weeks but symptoms resolved and pt was never evaluated for the same. +nausea. Pt states he voided prior to arrival; blood noted in urine.

## 2018-09-16 NOTE — ED Provider Notes (Signed)
[facility] Emergency Department Provider Note    First MD Initiated Contact with Patient 09/16/18 [phone]     (approximate)  I have reviewed the triage vital signs and the nursing notes.   HISTORY  Chief Complaint Abdominal Pain    HPI Eric Mitchell is a 43 y.o. male with below listed previous medical conditions presents to the emergency department secondary to 10 out of 10 right lower quadrant/right flank pain which patient states began 30 minutes before arrival.  Patient states he had a similar episode 2 weeks ago that spontaneously resolved.  Patient also admits to hematuria before arrival to the emergency department tonight.  Patient also admits to nausea however no vomiting.  Patient denies any diarrhea constipation.  Patient denies any fever        Past Medical History:  Diagnosis Date   Hypertension    Migraines    Wears contact lenses     There are no active problems to display for this patient.   History reviewed. No pertinent surgical history.  Prior to Admission medications   Medication Sig Start Date End Date Taking? Authorizing Provider  ondansetron (ZOFRAN ODT) 4 MG disintegrating tablet Take 1 tablet (4 mg total) by mouth every 8 (eight) hours as needed. 09/16/18   Gregor Hams, MD  oxyCODONE-acetaminophen (PERCOCET) 5-325 MG tablet Take 1 tablet by mouth every 4 (four) hours as needed. 09/16/18 09/16/19  Gregor Hams, MD  tamsulosin (FLOMAX) 0.4 MG CAPS capsule Take 1 capsule (0.4 mg total) by mouth daily after breakfast for 14 days. 09/16/18 09/30/18  Gregor Hams, MD    Allergies Patient has no known allergies.  No family history on file.  Social History Social History   Tobacco Use   Smoking status: Current Every Day Smoker    Packs/day: 0.00    Types: Cigarettes   Smokeless tobacco: Never Used  Substance Use Topics   Alcohol use: No   Drug use: No    Review of Systems Constitutional: No  fever/chills Eyes: No visual changes. ENT: No sore throat. Cardiovascular: Denies chest pain. Respiratory: Denies shortness of breath. Gastrointestinal: Positive for right flank/right lower quadrant abdominal pain.  No nausea, no vomiting.  No diarrhea.  No constipation. Genitourinary: Negative for dysuria. Musculoskeletal: Negative for neck pain.  Negative for back pain. Integumentary: Negative for rash. Neurological: Negative for headaches, focal weakness or numbness.  ____________________________________________   PHYSICAL EXAM:  VITAL SIGNS: ED Triage Vitals  Enc Vitals Group     BP 09/16/18 0550 (!) 149/93     Pulse Rate 09/16/18 0550 80     Resp 09/16/18 0550 (!) 22     Temp 09/16/18 0550 (!) 97.1 F (36.2 C)     Temp Source 09/16/18 0550 Oral     SpO2 09/16/18 0550 97 %     Weight 09/16/18 0545 108.9 kg (240 lb)     Height 09/16/18 0545 1.803 m (5\' 11" )     Head Circumference --      Peak Flow --      Pain Score 09/16/18 0545 10     Pain Loc --      Pain Edu? --      Excl. in [place]? --     Constitutional: Alert and oriented.  Apparent discomfort Eyes: Conjunctivae are normal.  Mouth/Throat: Mucous membranes are moist. Neck: No stridor.  No meningeal signs.   Cardiovascular: Normal rate, regular rhythm. Good peripheral circulation. Grossly normal heart sounds. Respiratory:  Normal respiratory effort.  No retractions. Gastrointestinal: Soft and nontender. No distention.  Musculoskeletal: No lower extremity tenderness nor edema. No gross deformities of extremities. Neurologic:  Normal speech and language. No gross focal neurologic deficits are appreciated.  Skin:  Skin is warm, dry and intact. Psychiatric: Mood and affect are normal. Speech and behavior are normal.  ____________________________________________   LABS (all labs ordered are listed, but only abnormal results are displayed)  Labs Reviewed  LIPASE, BLOOD - Abnormal; Notable for the following  components:      Result Value   Lipase 52 (*)    All other components within normal limits  COMPREHENSIVE METABOLIC PANEL - Abnormal; Notable for the following components:   Glucose, Bld 169 (*)    All other components within normal limits  CBC  URINALYSIS, COMPLETE (UACMP) WITH MICROSCOPIC   ____________________________________  RADIOLOGY I, Hoberg N Deborrah Mabin, personally viewed and evaluated these images (plain radiographs) as part of my medical decision making, as well as reviewing the written report by the radiologist.  ED MD interpretation: Obstructing 3 mm distal right urethral calculus per radiologist..    Official radiology report(s): Ct Renal Stone Study  Result Date: 09/16/2018 CLINICAL DATA:  Flank pain with stone disease suspected on the right EXAM: CT ABDOMEN AND PELVIS WITHOUT CONTRAST TECHNIQUE: Multidetector CT imaging of the abdomen and pelvis was performed following the standard protocol without IV contrast. COMPARISON:  None. FINDINGS: Lower chest:  No contributory findings. Hepatobiliary: Hepatic steatosis.No evidence of biliary obstruction or stone. Pancreas: Unremarkable. Spleen: Unremarkable. Adrenals/Urinary Tract: Negative adrenals. Right hydronephrosis and low-density renal expansion secondary to a 3 mm stone between the iliac crossing and the UVJ . there is a 6 mm right lower pole calculus. 2 mm left lower pole calculus. Stomach/Bowel:  No obstruction. No evidence of bowel inflammation. Vascular/Lymphatic: No acute vascular abnormality. No mass or adenopathy. Reproductive:No pathologic findings. Other: No ascites or pneumoperitoneum. Musculoskeletal: No acute abnormalities. 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 IMPRESSION: 1. Obstructing 3 mm distal right ureteral calculus. 2. Bilateral renal calculi measuring up to 6 mm on the right. 3. Hepatic steatosis. Electronically Signed   By: Monte Fantasia M.D.   On: 09/16/2018 06:46       Procedures   ____________________________________________   INITIAL IMPRESSION / MDM / ASSESSMENT AND PLAN / ED COURSE  As part of my medical decision making, I reviewed the following data within the electronic MEDICAL RECORD NUMBER   43 year old male presented with above-stated history and physical exam consistent with right ureterolithiasis which was confirmed on CT.  Patient given IV morphine 4 mg with minimal improvement of pain and as such 1 mg of IV Dilaudid was given.  After reviewing CT scan and laboratory findings patient given Toradol 30 mg.  On reevaluation patient is pain-free at present.  Awaiting urinalysis with anticipation of discharge if no evidence of infection.  Patient's care transferred to Dr. Ellender Hose.  ____________________________________________  FINAL CLINICAL IMPRESSION(S) / ED DIAGNOSES  Final diagnoses:  Right kidney stone     MEDICATIONS GIVEN DURING THIS VISIT:  Medications  sodium chloride flush (NS) 0.9 % injection 3 mL (has no administration in time range)  morphine 4 MG/ML injection 4 mg (4 mg Intravenous Given 09/16/18 0557)  ondansetron (ZOFRAN) injection 4 mg (4 mg Intravenous Given 09/16/18 0557)  sodium chloride 0.9 % bolus 1,000 mL (0 mLs Intravenous Stopped 09/16/18 0711)  ketorolac (TORADOL) 30 MG/ML injection 30 mg (30 mg Intravenous Given  09/16/18 [id number])  HYDROmorphone (DILAUDID) injection 1 mg (1 mg Intravenous Given 09/16/18 [id number])     ED Discharge Orders         Ordered    oxyCODONE-acetaminophen (PERCOCET) 5-325 MG tablet  Every 4 hours PRN     09/16/18 0655    tamsulosin (FLOMAX) 0.4 MG CAPS capsule  Daily after breakfast     09/16/18 0655    ondansetron (ZOFRAN ODT) 4 MG disintegrating tablet  Every 8 hours PRN     09/16/18 [id number]          *Please note:  Mcarthur Bernasconi was evaluated in Emergency Department on 09/16/2018 for the symptoms described in the history of present illness. He was evaluated in the context of the global  COVID-19 pandemic, which necessitated consideration that the patient might be at risk for infection with the SARS-CoV-2 virus that causes COVID-19. Institutional protocols and algorithms that pertain to the evaluation of patients at risk for COVID-19 are in a state of rapid change based on information released by regulatory bodies including the CDC and federal and state organizations. These policies and algorithms were followed during the patient's care in the ED.  Some ED evaluations and interventions may be delayed as a result of limited staffing during the pandemic.*  Note:  This document was prepared using Dragon voice recognition software and may include unintentional dictation errors.   Gregor Hams, MD 09/16/18 [phone]

## 2018-09-17 LAB — URINE CULTURE: Culture: NO GROWTH

## 2019-01-30 ENCOUNTER — Other Ambulatory Visit: Payer: Managed Care, Other (non HMO)

## 2020-02-02 ENCOUNTER — Other Ambulatory Visit: Payer: Self-pay

## 2020-02-02 DIAGNOSIS — Z20822 Contact with and (suspected) exposure to covid-19: Secondary | ICD-10-CM

## 2020-02-06 LAB — NOVEL CORONAVIRUS, NAA: SARS-CoV-2, NAA: NOT DETECTED

## 2020-02-13 DIAGNOSIS — R350 Frequency of micturition: Secondary | ICD-10-CM | POA: Diagnosis not present

## 2020-02-13 DIAGNOSIS — M79641 Pain in right hand: Secondary | ICD-10-CM | POA: Diagnosis not present

## 2020-02-13 DIAGNOSIS — R252 Cramp and spasm: Secondary | ICD-10-CM | POA: Diagnosis not present

## 2020-02-13 DIAGNOSIS — R739 Hyperglycemia, unspecified: Secondary | ICD-10-CM | POA: Diagnosis not present

## 2020-02-13 DIAGNOSIS — M79642 Pain in left hand: Secondary | ICD-10-CM | POA: Diagnosis not present

## 2020-02-14 DIAGNOSIS — E785 Hyperlipidemia, unspecified: Secondary | ICD-10-CM | POA: Diagnosis not present

## 2020-02-14 DIAGNOSIS — E663 Overweight: Secondary | ICD-10-CM | POA: Diagnosis not present

## 2020-02-14 DIAGNOSIS — R739 Hyperglycemia, unspecified: Secondary | ICD-10-CM | POA: Diagnosis not present

## 2020-02-14 DIAGNOSIS — I10 Essential (primary) hypertension: Secondary | ICD-10-CM | POA: Diagnosis not present

## 2020-02-20 DIAGNOSIS — I10 Essential (primary) hypertension: Secondary | ICD-10-CM | POA: Diagnosis not present

## 2020-02-20 DIAGNOSIS — R252 Cramp and spasm: Secondary | ICD-10-CM | POA: Diagnosis not present

## 2020-02-20 DIAGNOSIS — R739 Hyperglycemia, unspecified: Secondary | ICD-10-CM | POA: Diagnosis not present

## 2020-02-20 DIAGNOSIS — E785 Hyperlipidemia, unspecified: Secondary | ICD-10-CM | POA: Diagnosis not present

## 2020-05-22 ENCOUNTER — Encounter: Payer: Self-pay | Admitting: Emergency Medicine

## 2020-05-22 ENCOUNTER — Ambulatory Visit
Admission: EM | Admit: 2020-05-22 | Discharge: 2020-05-22 | Disposition: A | Discharge status: Home / Self Care | Payer: 59 | Attending: Emergency Medicine | Admitting: Emergency Medicine

## 2020-05-22 ENCOUNTER — Other Ambulatory Visit: Payer: Self-pay

## 2020-05-22 DIAGNOSIS — R319 Hematuria, unspecified: Secondary | ICD-10-CM | POA: Insufficient documentation

## 2020-05-22 DIAGNOSIS — Z87442 Personal history of urinary calculi: Secondary | ICD-10-CM | POA: Insufficient documentation

## 2020-05-22 LAB — POCT URINALYSIS DIP (MANUAL ENTRY)
Glucose, UA: NEGATIVE mg/dL
Ketones, POC UA: NEGATIVE mg/dL
Leukocytes, UA: NEGATIVE
Nitrite, UA: NEGATIVE
Protein Ur, POC: 30 mg/dL — AB
Spec Grav, UA: >=1.03 — AB (ref 1.010–1.025)
Urobilinogen, UA: 1 E.U./dL
pH, UA: 5.5 (ref 5.0–8.0)

## 2020-05-22 MED ORDER — IBUPROFEN 800 MG PO TABS: 800.0000 mg | ORAL_TABLET | Freq: Three times a day (TID) | ORAL | 0 refills | Status: DC

## 2020-05-22 MED ORDER — TAMSULOSIN HCL 0.4 MG PO CAPS: 0.4000 mg | ORAL_CAPSULE | Freq: Every day | ORAL | 0 refills | Status: DC

## 2020-05-22 NOTE — Discharge Instructions (Signed)
Urine with blood Could be from kidney stone.  No other signs of infection at this time, but we will culture your urine to make sure.   Push fluids and get plenty of rest.   flomax prescribed for possible kidney stones Ibuprofen for pain as needed Follow up with PCP next week for recheck and to ensure symptoms are improving/ resolved Return here or go to ER if you have any new or worsening symptoms such as fever, worsening abdominal pain, nausea/vomiting, flank pain, etc..Marland Kitchen

## 2020-05-22 NOTE — ED Provider Notes (Signed)
[facility]   CC: Blood in urine  SUBJECTIVE:  Eric Mitchell is a 45 y.o. male who complains of blood in urine x 1 day.  Patient denies a precipitating event, recent sexual encounter, excessive caffeine intake.  Denies pain.  Does have hx of kidney stone.  Has NOT tried OTC medications.  Symptoms are made worse with urination.  Admits to similar symptoms in the past with stone.  Denies fever, chills, nausea, vomiting, abdominal pain, flank pain, urethral discharge, testicular swelling/ pain, concern for STDs.    Was told by PCP to come here.    ROS: As in HPI.  All other pertinent ROS negative.     Past Medical History:  Diagnosis Date  . Hypertension   . Migraines   . Wears contact lenses    History reviewed. No pertinent surgical history. No Known Allergies No current facility-administered medications on file prior to encounter.   Current Outpatient Medications on File Prior to Encounter  Medication Sig Dispense Refill  . ondansetron (ZOFRAN ODT) 4 MG disintegrating tablet Take 1 tablet (4 mg total) by mouth every 8 (eight) hours as needed. 20 tablet 0   Social History   Socioeconomic History  . Marital status: Single    Spouse name: Not on file  . Number of children: Not on file  . Years of education: Not on file  . Highest education level: Not on file  Occupational History  . Not on file  Tobacco Use  . Smoking status: Current Every Day Smoker    Packs/day: 0.00    Types: Cigarettes  . Smokeless tobacco: Never Used  Vaping Use  . Vaping Use: Never used  Substance and Sexual Activity  . Alcohol use: No  . Drug use: No  . Sexual activity: Not on file  Other Topics Concern  . Not on file  Social History Narrative  . Not on file   Social Determinants of Health   Financial Resource Strain: Not on file  Food Insecurity: Not on file  Transportation Needs: Not on file  Physical Activity: Not on file  Stress: Not on file  Social Connections: Not on  file  Intimate Partner Violence: Not on file   No family history on file.  OBJECTIVE:  Vitals:   05/22/20 0954  BP: (!) 143/95  Pulse: 72  Resp: 17  Temp: 98.1 F (36.7 C)  TempSrc: Oral  SpO2: 97%   General appearance: AOx3 in no acute distress HEENT: NCAT.  Oropharynx clear.  Lungs: clear to auscultation bilaterally without adventitious breath sounds Heart: regular rate and rhythm.   Abdomen: soft; non-distended; no tenderness; bowel sounds present; no guarding Back: no CVA tenderness Extremities: no edema; symmetrical with no gross deformities Skin: warm and dry Neurologic: Ambulates from chair to exam table without difficulty Psychological: alert and cooperative; normal mood and affect  Labs Reviewed  POCT URINALYSIS DIP (MANUAL ENTRY) - Abnormal; Notable for the following components:      Result Value   Color, UA brown (*)    Bilirubin, UA small (*)    Spec Grav, UA >=1.030 (*)    Blood, UA large (*)    Protein Ur, POC =30 (*)    All other components within normal limits  URINE CULTURE    ASSESSMENT & PLAN:  1. Hematuria, unspecified type     Meds ordered this encounter  Medications  . tamsulosin (FLOMAX) 0.4 MG CAPS capsule    Sig: Take 1 capsule (0.4 mg total)  by mouth daily after breakfast.    Dispense:  30 capsule    Refill:  0    Order Specific Question:   Supervising Provider    Answer:   Raylene Everts [[id number]]  . ibuprofen (ADVIL) 800 MG tablet    Sig: Take 1 tablet (800 mg total) by mouth 3 (three) times daily.    Dispense:  21 tablet    Refill:  0    Order Specific Question:   Supervising Provider    Answer:   Raylene Everts [[id number]]   Urine with blood Could be from kidney stone.  No other signs of infection at this time, but we will culture your urine to make sure.   Push fluids and get plenty of rest.   flomax prescribed for possible kidney stones Ibuprofen for pain as needed Follow up with PCP next week for recheck and to  ensure symptoms are improving/ resolved Return here or go to ER if you have any new or worsening symptoms such as fever, worsening abdominal pain, nausea/vomiting, flank pain, etc...  Outlined signs and symptoms indicating need for more acute intervention. Patient verbalized understanding. After Visit Summary given.     Lestine Box, PA-C 05/22/20 1012

## 2020-05-22 NOTE — ED Triage Notes (Signed)
Blood in urine that started last night

## 2020-05-24 LAB — URINE CULTURE: Culture: <10000 — AB

## 2020-12-18 IMAGING — CT CT RENAL STONE PROTOCOL
2 of 4 series · 16 of 46 positions shown, 18 images · non-contrast
Comparison: None.

CLINICAL DATA: Flank pain with stone disease suspected on the right

EXAM:
CT ABDOMEN AND PELVIS WITHOUT CONTRAST
TECHNIQUE: Multidetector CT imaging of the abdomen and pelvis was performed
following the standard protocol without IV contrast.

[Series 2: stone full standard · axial · 0.78mm/px · z∈[-1208,-734]mm · 13 of 105 slices shown, 15 images]
[im 5/105  soft-tissue]
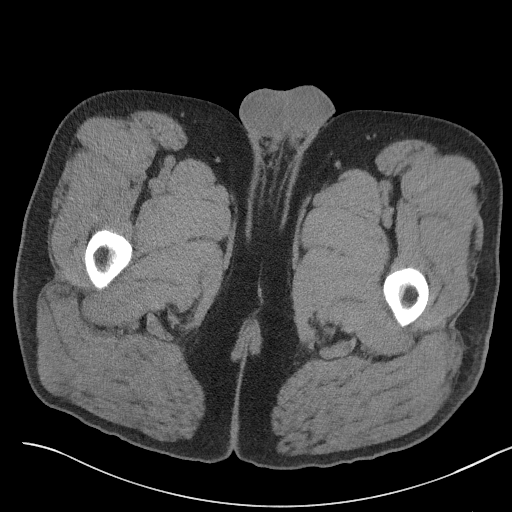
[im 5/105  bone]
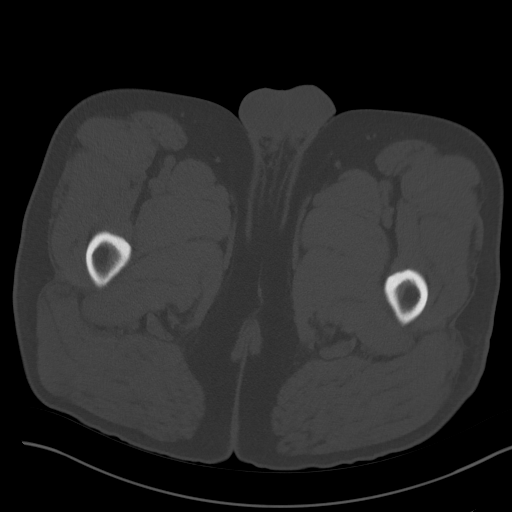
[im 14/105  soft-tissue]
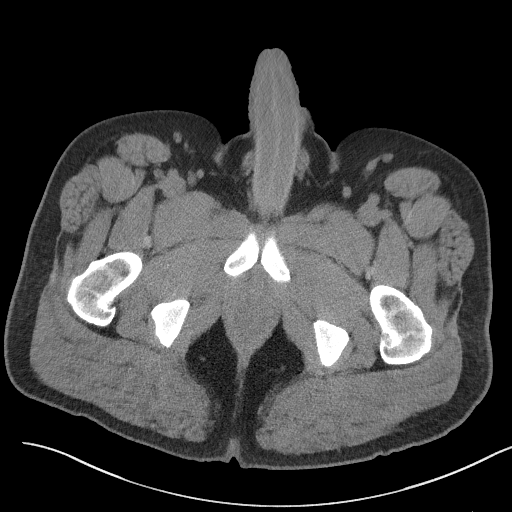
[im 22/105  soft-tissue]
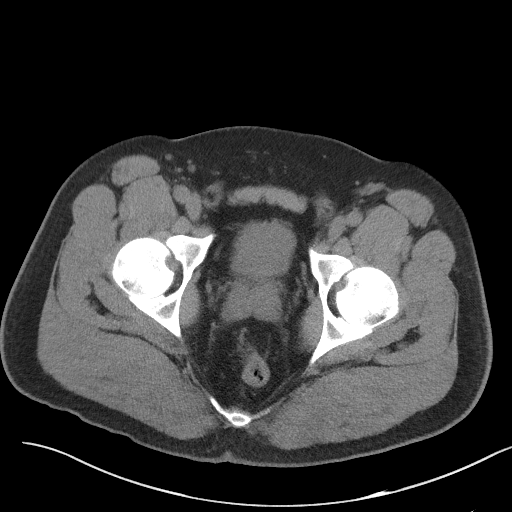
[im 31/105  soft-tissue]
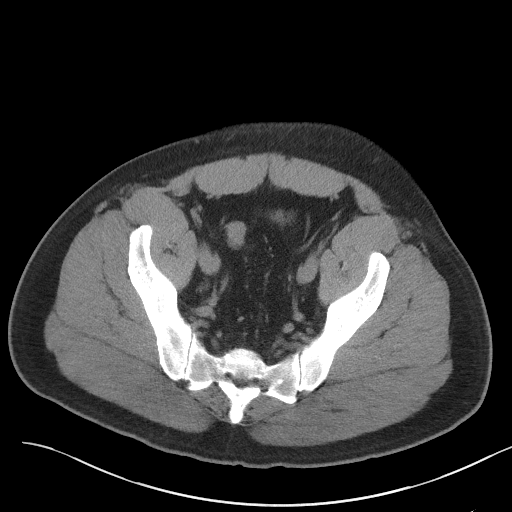
[im 35/105  soft-tissue]
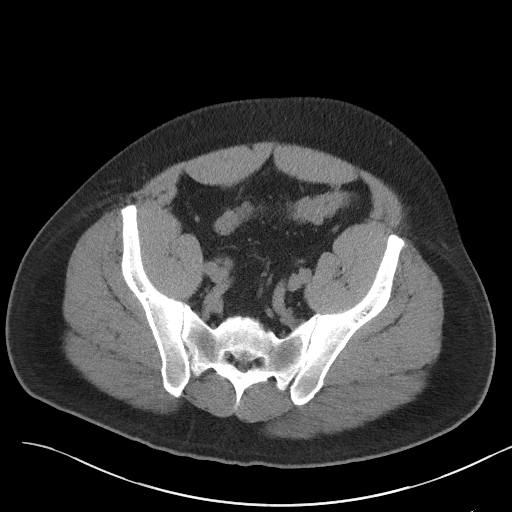
[im 44/105  soft-tissue]
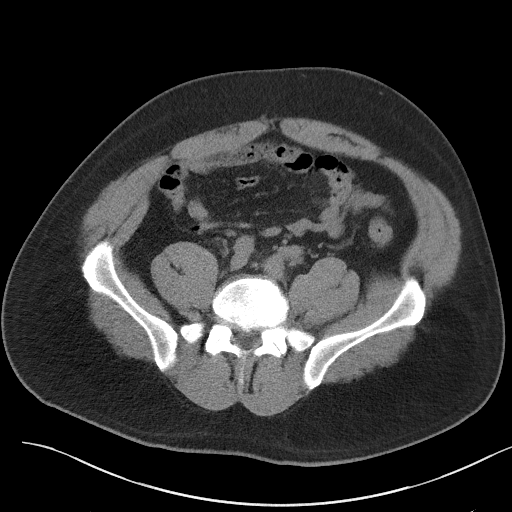
[im 53/105  soft-tissue]
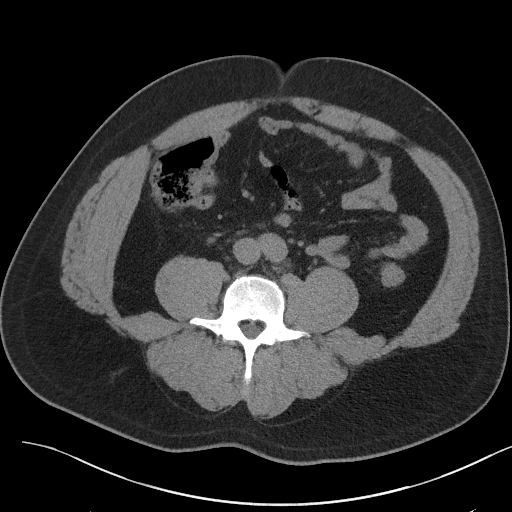
[im 61/105  soft-tissue]
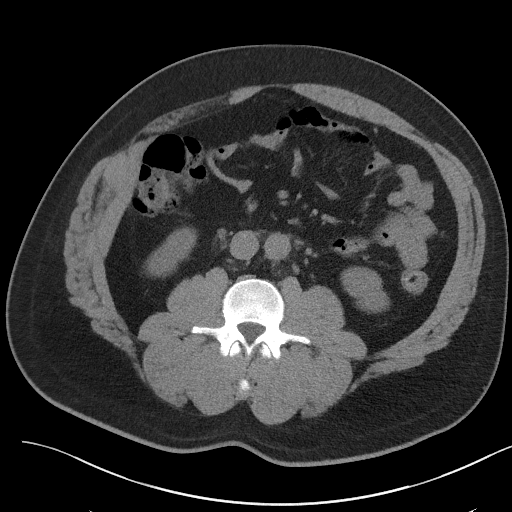
[im 70/105  soft-tissue]
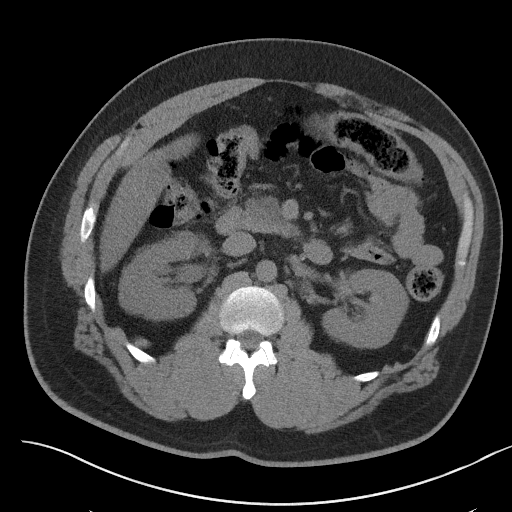
[im 70/105  bone]
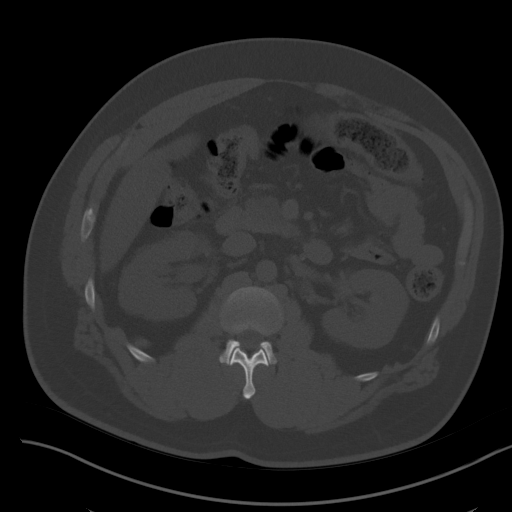
[im 74/105  soft-tissue]
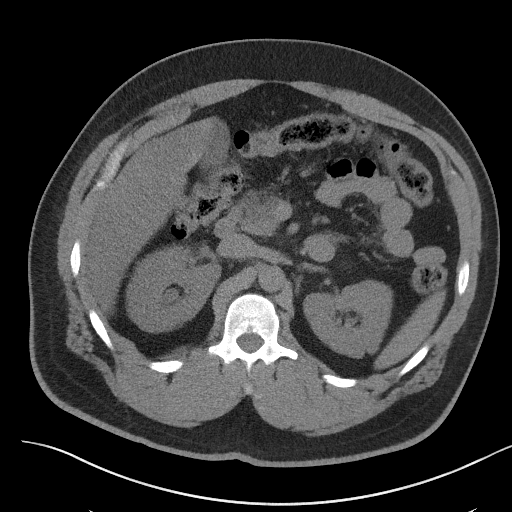
[im 83/105  soft-tissue]
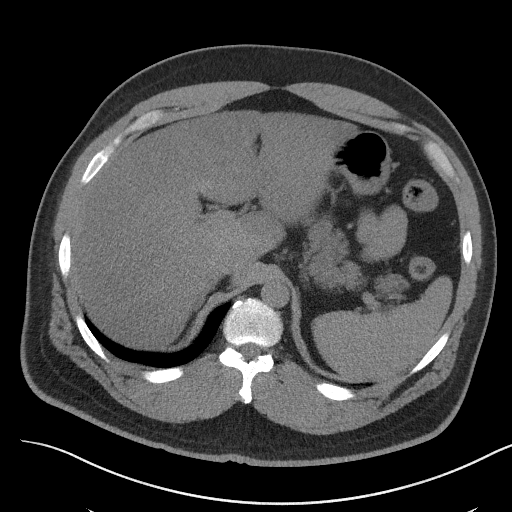
[im 92/105  soft-tissue]
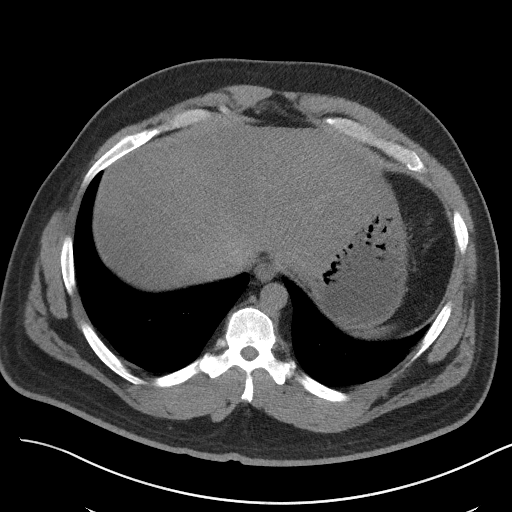
[im 100/105  soft-tissue]
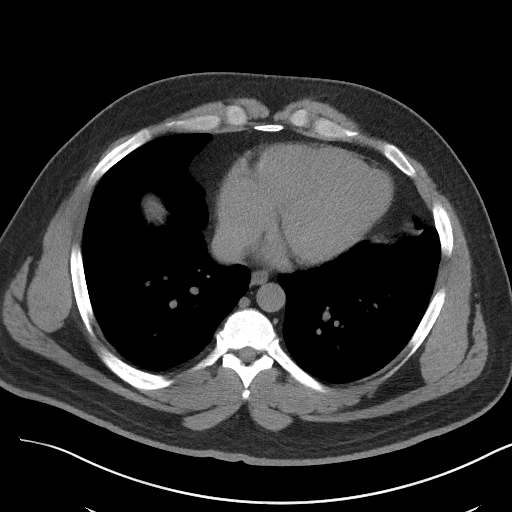

[Series 5: coronal · coronal · 0.86mm/px · 3 of 134 slices shown]
[im 45/134  soft-tissue]
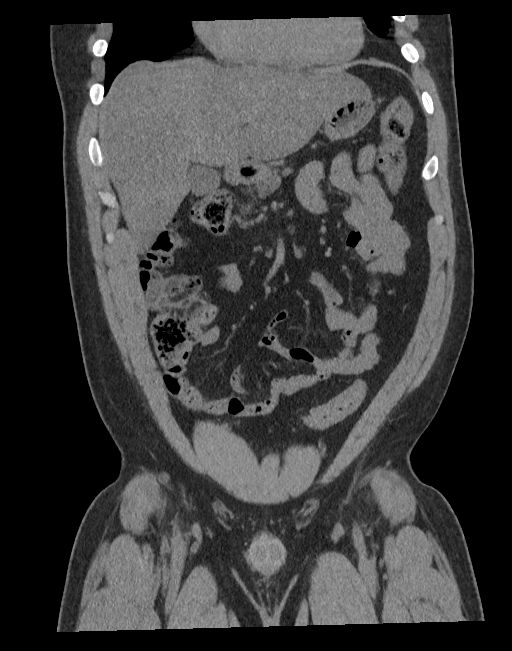
[im 60/134  soft-tissue]
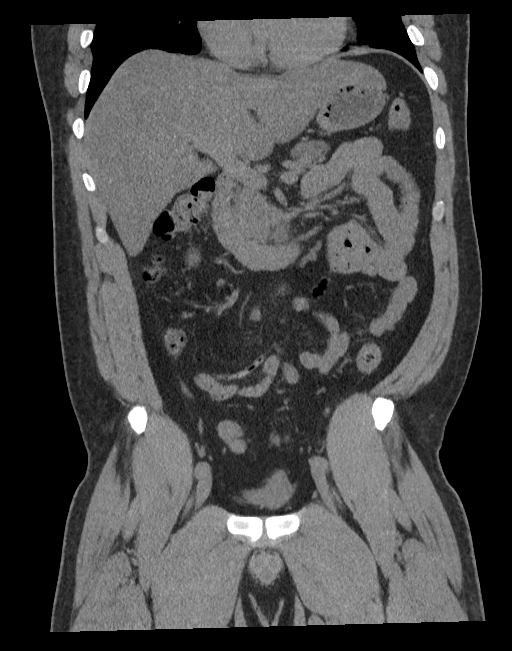
[im 74/134  soft-tissue]
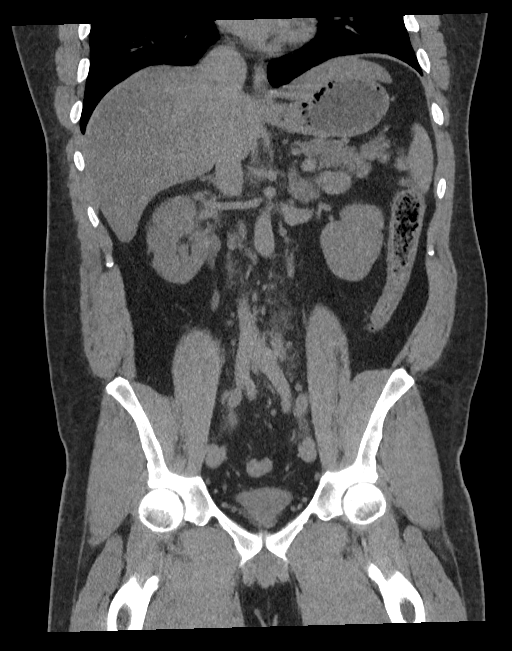

[16 of 46 positions shown; findings below may reference images not displayed]

FINDINGS: Lower chest:  No contributory findings.

Hepatobiliary: Hepatic steatosis.No evidence of biliary obstruction
or stone.

Pancreas: Unremarkable.

Spleen: Unremarkable.

Adrenals/Urinary Tract: Negative adrenals. Right hydronephrosis and
low-density renal expansion secondary to a 3 mm stone between the
iliac crossing and the UVJ . there is a 6 mm right lower pole
calculus. 2 mm left lower pole calculus.

Stomach/Bowel:  No obstruction. No evidence of bowel inflammation.

Vascular/Lymphatic: No acute vascular abnormality. No mass or
adenopathy.

Reproductive:No pathologic findings.

Other: No ascites or pneumoperitoneum.

Musculoskeletal: No acute abnormalities. L4-5 and L5-S1 advanced
disc degeneration with prominent L4-5 spinal stenosis.
IMPRESSION: 1. Obstructing 3 mm distal right ureteral calculus.
2. Bilateral renal calculi measuring up to 6 mm on the right.
3. Hepatic steatosis.

## 2021-08-15 ENCOUNTER — Telehealth: Payer: Self-pay

## 2021-08-15 ENCOUNTER — Other Ambulatory Visit: Payer: Self-pay

## 2021-08-15 DIAGNOSIS — Z1211 Encounter for screening for malignant neoplasm of colon: Secondary | ICD-10-CM

## 2021-08-15 DIAGNOSIS — E6609 Other obesity due to excess calories: Secondary | ICD-10-CM | POA: Insufficient documentation

## 2021-08-15 DIAGNOSIS — E669 Obesity, unspecified: Secondary | ICD-10-CM | POA: Insufficient documentation

## 2021-08-15 MED ORDER — NA SULFATE-K SULFATE-MG SULF 17.5-3.13-1.6 GM/177ML PO SOLN: 1.0000 | Freq: Once | ORAL | 0 refills | Status: AC

## 2021-08-15 NOTE — Telephone Encounter (Signed)
Gastroenterology Pre-Procedure Review  Request Date: 09/29/21 Requesting Physician: Dr. Vicente Males  PATIENT REVIEW QUESTIONS: The patient responded to the following health history questions as indicated:    1. Are you having any GI issues? no 2. Do you have a personal history of Polyps? no 3. Do you have a family history of Colon Cancer or Polyps? no 4. Diabetes Mellitus? yes (Pt stated yes but not noted in Epic Problem List) 5. Joint replacements in the past 12 months?no 6. Major health problems in the past 3 months?no 7. Any artificial heart valves, MVP, or defibrillator?no    MEDICATIONS & ALLERGIES:    Patient reports the following regarding taking any anticoagulation/antiplatelet therapy:   Plavix, Coumadin, Eliquis, Xarelto, Lovenox, Pradaxa, Brilinta, or Effient? no Aspirin? no  Patient confirms/reports the following medications:  Current Outpatient Medications  Medication Sig Dispense Refill   ibuprofen (ADVIL) 800 MG tablet Take 1 tablet (800 mg total) by mouth 3 (three) times daily. 21 tablet 0   ondansetron (ZOFRAN ODT) 4 MG disintegrating tablet Take 1 tablet (4 mg total) by mouth every 8 (eight) hours as needed. 20 tablet 0   tamsulosin (FLOMAX) 0.4 MG CAPS capsule Take 1 capsule (0.4 mg total) by mouth daily after breakfast. 30 capsule 0   No current facility-administered medications for this visit.    Patient confirms/reports the following allergies:  No Known Allergies  No orders of the defined types were placed in this encounter.   AUTHORIZATION INFORMATION Primary Insurance: 1D#: Group #:  Secondary Insurance: 1D#: Group #:  SCHEDULE INFORMATION: Date: 09/29/21 Time: Location: [facility]

## 2021-08-18 ENCOUNTER — Other Ambulatory Visit: Payer: Self-pay

## 2021-08-18 DIAGNOSIS — N5082 Scrotal pain: Secondary | ICD-10-CM

## 2021-08-19 ENCOUNTER — Other Ambulatory Visit: Payer: Self-pay | Admitting: *Deleted

## 2021-08-19 ENCOUNTER — Other Ambulatory Visit
Admission: RE | Admit: 2021-08-19 | Discharge: 2021-08-19 | Disposition: A | Discharge status: Home / Self Care | Payer: 59 | Attending: Urology | Admitting: Urology

## 2021-08-19 ENCOUNTER — Ambulatory Visit (INDEPENDENT_AMBULATORY_CARE_PROVIDER_SITE_OTHER): Payer: 59 | Admitting: Urology

## 2021-08-19 ENCOUNTER — Encounter: Payer: Self-pay | Admitting: Urology

## 2021-08-19 VITALS — BP 137/91 | HR 103 | Ht 70.0 in | Wt 230.0 lb

## 2021-08-19 DIAGNOSIS — Z3009 Encounter for other general counseling and advice on contraception: Secondary | ICD-10-CM | POA: Diagnosis not present

## 2021-08-19 DIAGNOSIS — N5082 Scrotal pain: Secondary | ICD-10-CM | POA: Diagnosis present

## 2021-08-19 LAB — URINALYSIS, COMPLETE (UACMP) WITH MICROSCOPIC
Bacteria, UA: NONE SEEN
Bilirubin Urine: NEGATIVE
Glucose, UA: 500 mg/dL — AB
Hgb urine dipstick: NEGATIVE
Ketones, ur: NEGATIVE mg/dL
Leukocytes,Ua: NEGATIVE
Nitrite: NEGATIVE
Protein, ur: NEGATIVE mg/dL
RBC / HPF: NONE SEEN RBC/hpf (ref 0–5)
Specific Gravity, Urine: 1.025 (ref 1.005–1.030)
Squamous Epithelial / HPF: NONE SEEN (ref 0–5)
pH: 5.5 (ref 5.0–8.0)

## 2021-08-19 MED ORDER — DIAZEPAM 10 MG PO TABS: 10.0000 mg | ORAL_TABLET | Freq: Once | ORAL | 0 refills | Status: DC | PRN

## 2021-08-19 NOTE — Progress Notes (Signed)
   08/19/21 2:23 PM   Eric Mitchell 06-30-1975 [id number]  CC: Discuss vasectomy  HPI: 46 year old male interested in vasectomy for permanent sterilization.  He has 3 biologic children, and 10 stepchildren.  He does not desire any further biologic pregnancies.  He denies any problems with erections or urinary symptoms.  No family history of prostate cancer.  PMH: Past Medical History:  Diagnosis Date   Hypertension    Migraines    Wears contact lenses     Family History: No family history on file.  Social History:  reports that he has been smoking cigarettes. He has never used smokeless tobacco. He reports that he does not drink alcohol and does not use drugs.  Physical Exam: BP (!) 137/91 (BP Location: Left Arm, Patient Position: Sitting, Cuff Size: Large)   Pulse (!) 103   Ht '5\' 10"'$  (1.778 m)   Wt 230 lb (104.3 kg)   BMI 33.00 kg/m    Constitutional:  Alert and oriented, No acute distress. Cardiovascular: No clubbing, cyanosis, or edema. Respiratory: Normal respiratory effort, no increased work of breathing. GI: Abdomen is soft, nontender, nondistended, no abdominal masses GU: Circumcised phallus with patent meatus, no lesions, testicles 20 cc and descended bilaterally, vas deferens easily palpable   Assessment & Plan:   46 year old male interested in vasectomy for permanent sterilization  We discussed the risks and benefits of vasectomy at length.  Vasectomy is intended to be a permanent form of contraception, and does not produce immediate sterility.  Following vasectomy another form of contraception is required until vas occlusion is confirmed by a post-vasectomy semen analysis obtained 2-3 months after the procedure.  Even after vas occlusion is confirmed, vasectomy is not 100% reliable in preventing pregnancy, and the failure rate is approximately 01/1998.  Repeat vasectomy is required in less than 1% of patients.  He should refrain from ejaculation for 1 week after  vasectomy.  Options for fertility after vasectomy include vasectomy reversal, and sperm retrieval with in vitro fertilization or ICSI.  These options are not always successful and may be expensive.  Finally, there are other permanent and non-permanent alternatives to vasectomy available. There is no risk of erectile dysfunction, and the volume of semen will be similar to prior, as the majority of the ejaculate is from the prostate and seminal vesicles.   The procedure takes ~20 minutes.  We recommend patients take 5-10 mg of Valium 30 minutes prior, and he will need a driver post-procedure.  Local anesthetic is injected into the scrotal skin and a small segment of the vas deferens is removed, and the ends occluded. The complication rate is approximately 1-2%, and includes bleeding, infection, and development of chronic scrotal pain.  PLAN: Schedule vasectomy Valium sent to pharmacy  Nickolas Madrid, MD 08/19/2021  [facility] [address] [place], [place] [address] [phone]

## 2021-08-19 NOTE — Patient Instructions (Addendum)
Pre-Vasectomy Instructions  STOP all aspirin or blood thinners (Aspirin, Plavix, Coumadin, Warfarin, Motrin, Ibuprofen, Advil, Aleve, Naproxen, Naprosyn) for 7 days prior to the procedure.  If you have any questions about stopping these medications please contact your primary care physician or cardiologist.  Shave all hair from the upper scrotum on the day of the procedure.  This means just under the penis onto the scrotal sac.  The area shaved should measure about 2-3 inches around.  You may lather the scrotum with soap and water, and shave with a safety razor.  After shaving the area, thoroughly wash the penis and the scrotum, then shower or bathe to remove all the loose hairs.  If needed, wash the area again just before coming in for your Vasectomy.  It is recommended to have a light meal an hour or so prior to the procedure.  Bring a scrotal support (jock strap or suspensory, or tight jockey shorts or underwear).  Wear comfortable pants or shorts.  While the actual procedure usually takes about 45 minutes, you should be prepared to stay in the office for approximately one hour.  Bring someone with you to drive you home.  If you have any questions or concerns, please feel free to call the office at [phone].     Vasectomy Vasectomy is a procedure in which the vas deferens is cut and then tied or burned (cauterized). The vas deferens is a tube that carries sperm from the testicle to the part of the body that drains urine from the bladder (urethra). This procedure blocks sperm from going through the vas deferens and penis during ejaculation. This ensures that sperm does not go into the vagina during sex. Vasectomy does not affect sexual desire or performance and does not prevent sexually transmitted infections. Vasectomy is considered a permanent and very effective form of birth control (contraception). The decision to have a vasectomy should not be made during a stressful time, such as  after the loss of a pregnancy or a divorce. You and your partner should decide on whether to have a vasectomy when you are sure that you do not want children in the future. Tell a health care provider about: Any allergies you have. All medicines you are taking, including vitamins, herbs, eye drops, creams, and over-the-counter medicines. Any problems you or family members have had with anesthetic medicines. Any blood disorders you have. Any surgeries you have had. Any medical conditions you have. What are the risks? Generally, this is a safe procedure. However, problems may occur, including: Infection. Bleeding and swelling of the scrotum. The scrotum is the sac that contains the testicles, blood vessels, and structures that help deliver sperm and semen. Allergic reactions to medicines. Failure of the procedure to prevent pregnancy. There is a very small chance that the tied or cauterized ends of the vas deferens may reconnect (recanalization). If this happens, you could still make a woman pregnant. Pain in the scrotum that continues after you heal from the procedure. What happens before the procedure? Medicines Ask your health care provider about: Changing or stopping your regular medicines. This is especially important if you are taking diabetes medicines or blood thinners. Taking medicines such as aspirin and ibuprofen. These medicines can thin your blood. Do not take these medicines unless your health care provider tells you to take them. Taking over-the-counter medicines, vitamins, herbs, and supplements. You may be told to take a medicine to help you relax (sedative) a few hours before the procedure. General  instructions Do not use any products that contain nicotine or tobacco for at least 4 weeks before the procedure. These products include cigarettes, e-cigarettes, and chewing tobacco. If you need help quitting, ask your health care provider. Plan to have a responsible adult take you  home from the hospital or clinic. If you will be going home right after the procedure, plan to have a responsible adult care for you for the time you are told. This is important. Ask your health care provider: How your surgery site will be marked. What steps will be taken to help prevent infection. These steps may include: Removing hair at the surgery site. Washing skin with a germ-killing soap. Taking antibiotic medicine. What happens during the procedure?  You will be given one or more of the following: A sedative, unless you were told to take this a few hours before the procedure. A medicine to numb the area (local anesthetic). Your health care provider will feel, or palpate, for your vas deferens. To reach the vas deferens, one of two methods may be used: A very small incision may be made in your scrotum. A punctured opening may be made in your scrotum, without an incision. Your vas deferens will be pulled out of your scrotum and cut. Then, the vas deferens will be closed in one of two ways: Tied at the ends. Cauterized at the ends to seal them off. The vas deferens will be put back into your scrotum. The incision or puncture opening will be closed with absorbable stitches (sutures). The sutures will eventually dissolve and will not need to be removed after the procedure. The procedure will be repeated on the other side of your scrotum. The procedure may vary among health care providers and hospitals. What happens after the procedure? You will be monitored to make sure that you do not have problems. You will be asked not to ejaculate for at least 1 week after the procedure, or for as long as you are told. You will need to use a different form of contraception for 2-4 months after the procedure, until you have test results confirming that there are no sperm in your semen. You may be given scrotal support to wear, such as a jockstrap or underwear with a supportive pouch. If you were  given a sedative during the procedure, it can affect you for several hours. Do not drive or operate machinery until your health care provider says that it is safe. Summary Vasectomy blocks sperm from being released during ejaculation. This procedure is considered a permanent and very effective form of birth control. Your scrotum will be numbed with medicine (local anesthetic) for the procedure. After the procedure, you will be asked not to ejaculate for at least 1 week, or for as long as you are told. You will also need to use a different form of contraception until your test results confirm that there are no sperm in your semen. This information is not intended to replace advice given to you by your health care provider. Make sure you discuss any questions you have with your health care provider. Document Revised: 05/25/2019 Document Reviewed: 05/25/2019 Elsevier Patient Education  [place].

## 2021-09-16 ENCOUNTER — Encounter: Payer: 59 | Admitting: Urology

## 2021-09-26 ENCOUNTER — Encounter: Payer: Self-pay | Admitting: Gastroenterology

## 2021-09-29 ENCOUNTER — Ambulatory Visit: Payer: 59 | Admitting: Anesthesiology

## 2021-09-29 ENCOUNTER — Ambulatory Visit
Admission: RE | Admit: 2021-09-29 | Discharge: 2021-09-29 | Disposition: A | Discharge status: Home / Self Care | Payer: 59 | Attending: Gastroenterology | Admitting: Gastroenterology

## 2021-09-29 ENCOUNTER — Encounter
Admission: RE | Disposition: A | Discharge status: Home / Self Care | Payer: Self-pay | Source: Home / Self Care | Attending: Gastroenterology

## 2021-09-29 DIAGNOSIS — D124 Benign neoplasm of descending colon: Secondary | ICD-10-CM | POA: Insufficient documentation

## 2021-09-29 DIAGNOSIS — Z1211 Encounter for screening for malignant neoplasm of colon: Secondary | ICD-10-CM | POA: Insufficient documentation

## 2021-09-29 DIAGNOSIS — D126 Benign neoplasm of colon, unspecified: Secondary | ICD-10-CM

## 2021-09-29 DIAGNOSIS — I10 Essential (primary) hypertension: Secondary | ICD-10-CM | POA: Diagnosis not present

## 2021-09-29 DIAGNOSIS — Z7984 Long term (current) use of oral hypoglycemic drugs: Secondary | ICD-10-CM | POA: Insufficient documentation

## 2021-09-29 DIAGNOSIS — E119 Type 2 diabetes mellitus without complications: Secondary | ICD-10-CM | POA: Diagnosis not present

## 2021-09-29 DIAGNOSIS — F17210 Nicotine dependence, cigarettes, uncomplicated: Secondary | ICD-10-CM | POA: Diagnosis not present

## 2021-09-29 DIAGNOSIS — D125 Benign neoplasm of sigmoid colon: Secondary | ICD-10-CM | POA: Diagnosis not present

## 2021-09-29 HISTORY — PX: COLONOSCOPY: SHX5424

## 2021-09-29 HISTORY — DX: Type 2 diabetes mellitus without complications: E11.9

## 2021-09-29 LAB — GLUCOSE, CAPILLARY: Glucose-Capillary: 152 mg/dL — ABNORMAL HIGH (ref 70–99)

## 2021-09-29 SURGERY — COLONOSCOPY
Anesthesia: General

## 2021-09-29 MED ORDER — LIDOCAINE HCL (CARDIAC) PF 100 MG/5ML IV SOSY
PREFILLED_SYRINGE | INTRAVENOUS | Status: DC | PRN
  Administered 2021-09-29: 50 mg via INTRAVENOUS

## 2021-09-29 MED ORDER — PROPOFOL 500 MG/50ML IV EMUL
INTRAVENOUS | Status: DC | PRN
  Administered 2021-09-29: 150 ug/kg/min via INTRAVENOUS

## 2021-09-29 MED ORDER — SODIUM CHLORIDE 0.9 % IV SOLN
INTRAVENOUS | Status: DC
  Administered 2021-09-29: 20 mL/h via INTRAVENOUS

## 2021-09-29 MED ORDER — PROPOFOL 10 MG/ML IV BOLUS
INTRAVENOUS | Status: DC | PRN
  Administered 2021-09-29: 70 mg via INTRAVENOUS

## 2021-09-29 NOTE — Op Note (Signed)
[facility]astro[facility] Patient Name: Eric Mitchell Procedure Date: 09/29/2021 10:02 AM MRN: [id number] Account #: [id number] Date of Birth: February 09, 1975 Admit Type: Outpatient Age: 46 Room: [facility] ENDO ROOM 1 Gender: Male Note Status: Finalized Instrument Name: Jasper Riling [id number] Procedure:             Colonoscopy Indications:           Screening for colorectal malignant neoplasm Providers:             Jonathon Bellows MD, MD Referring MD:          Lemmie Evens (Referring MD) Medicines:             Monitored Anesthesia Care Complications:         No immediate complications. Procedure:             Pre-Anesthesia Assessment:                        - Prior to the procedure, a History and Physical was                         performed, and patient medications, allergies and                         sensitivities were reviewed. The patient's tolerance                         of previous anesthesia was reviewed.                        - The risks and benefits of the procedure and the                         sedation options and risks were discussed with the                         patient. All questions were answered and informed                         consent was obtained.                        - ASA Grade Assessment: II - A patient with mild                         systemic disease.                        After obtaining informed consent, the colonoscope was                         passed under direct vision. Throughout the procedure,                         the patient's blood pressure, pulse, and oxygen                         saturations were monitored continuously. The                         Colonoscope was introduced through  the anus and                         advanced to the the cecum, identified by the                         appendiceal orifice. The colonoscopy was performed                         with ease. The patient tolerated the procedure well.                          The quality of the bowel preparation was excellent. Findings:      The perianal and digital rectal examinations were normal.      Two sessile polyps were found in the sigmoid colon and ascending colon.       The polyps were 4 to 5 mm in size. These polyps were removed with a cold       snare. Resection and retrieval were complete.      The exam was otherwise without abnormality on direct and retroflexion       views. Impression:            - Two 4 to 5 mm polyps in the sigmoid colon and in the                         ascending colon, removed with a cold snare. Resected                         and retrieved.                        - The examination was otherwise normal on direct and                         retroflexion views. Recommendation:        - Discharge patient to home (with escort).                        - Resume previous diet.                        - Continue present medications.                        - Await pathology results.                        - Repeat colonoscopy for surveillance based on                         pathology results. Procedure Code(s):     --- Professional ---                        [phone], Colonoscopy, flexible; with removal of                         tumor(s), polyp(s), or other lesion(s) by snare  technique Diagnosis Code(s):     --- Professional ---                        Z12.11, Encounter for screening for malignant neoplasm                         of colon                        K63.5, Polyp of colon CPT copyright 2019 American Medical Association. All rights reserved. The codes documented in this report are preliminary and upon coder review may  be revised to meet current compliance requirements. Jonathon Bellows, MD Jonathon Bellows MD, MD 09/29/2021 10:28:56 AM This report has been signed electronically. Number of Addenda: 0 Note Initiated On: 09/29/2021 10:02 AM Scope Withdrawal Time: 0 hours 7 minutes 25 seconds   Total Procedure Duration: 0 hours 10 minutes 27 seconds  Estimated Blood Loss:  Estimated blood loss: none.      [facility]

## 2021-09-29 NOTE — Transfer of Care (Signed)
Immediate Anesthesia Transfer of Care Note  Patient: Eric Mitchell  Procedure(s) Performed: COLONOSCOPy  Patient Location: PACU and Endoscopy Unit  Anesthesia Type:General  Level of Consciousness: drowsy and patient cooperative  Airway & Oxygen Therapy: Patient Spontanous Breathing  Post-op Assessment: Report given to RN and Post -op Vital signs reviewed and stable  Post vital signs: Reviewed and stable  Last Vitals:  Vitals Value Taken Time  BP 85/53 09/29/21 1031  Temp 35.6 C 09/29/21 1030  Pulse 77 09/29/21 1032  Resp 19 09/29/21 1032  SpO2 98 % 09/29/21 1032  Vitals shown include unvalidated device data.  Last Pain:  Vitals:   09/29/21 1030  TempSrc: Temporal  PainSc:          Complications: No notable events documented.

## 2021-09-29 NOTE — Anesthesia Preprocedure Evaluation (Addendum)
Anesthesia Evaluation  Patient identified by MRN, date of birth, ID band Patient awake    Reviewed: Allergy & Precautions, NPO status , Patient's Chart, lab work & pertinent test results  Airway Mallampati: III  TM Distance: >3 FB Neck ROM: full    Dental no notable dental hx.    Pulmonary Current Smoker and Patient abstained from smoking.,    Pulmonary exam normal        Cardiovascular Exercise Tolerance: Good hypertension, Normal cardiovascular exam     Neuro/Psych negative psych ROS   GI/Hepatic negative GI ROS, Neg liver ROS,   Endo/Other  diabetes, Type 2, Oral Hypoglycemic Agents  Renal/GU negative Renal ROS  negative genitourinary   Musculoskeletal   Abdominal Normal abdominal exam  (+)   Peds  Hematology   Anesthesia Other Findings Past Medical History: No date: Diabetes mellitus without complication ([place]) No date: Hypertension No date: Migraines No date: Wears contact lenses  No past surgical history on file.     Reproductive/Obstetrics negative OB ROS                            Anesthesia Physical Anesthesia Plan  ASA: 2  Anesthesia Plan: General   Post-op Pain Management: Minimal or no pain anticipated   Induction: Intravenous  PONV Risk Score and Plan: Propofol infusion and TIVA  Airway Management Planned: Natural Airway  Additional Equipment:   Intra-op Plan:   Post-operative Plan:   Informed Consent: I have reviewed the patients History and Physical, chart, labs and discussed the procedure including the risks, benefits and alternatives for the proposed anesthesia with the patient or authorized representative who has indicated his/her understanding and acceptance.     Dental Advisory Given  Plan Discussed with: Anesthesiologist, CRNA and Surgeon  Anesthesia Plan Comments:        Anesthesia Quick Evaluation

## 2021-09-29 NOTE — H&P (Signed)
Jonathon Bellows, MD [address] [place], [place], [place], [address] [address], [place], [place], [place], [address] Phone: [phone]  Fax: [phone]  Primary Care Physician:  Lemmie Evens, MD   Pre-Procedure History & Physical: HPI:  Seaton Hofmann is a 46 y.o. male is here for an colonoscopy.   Past Medical History:  Diagnosis Date   Diabetes mellitus without complication ([place])    Hypertension    Migraines    Wears contact lenses     No past surgical history on file.  Prior to Admission medications   Medication Sig Start Date End Date Taking? Authorizing Provider  buPROPion (WELLBUTRIN SR) 150 MG 12 hr tablet Take 150 mg by mouth 2 (two) times daily. 08/11/21  Yes [provider]  chlorthalidone (HYGROTON) 25 MG tablet Take 25 mg by mouth every morning. 08/11/21  Yes [provider]  diazepam (VALIUM) 10 MG tablet Take 1 tablet (10 mg total) by mouth once as needed for up to 1 dose for anxiety (TAKE 45 MINUTES PRIOR TO VASECTOMY). 08/19/21  Yes Billey Co, MD  ibuprofen (ADVIL) 800 MG tablet Take 1 tablet (800 mg total) by mouth 3 (three) times daily. 05/22/20  Yes Wurst, [place], PA-C  losartan (COZAAR) 100 MG tablet Take 100 mg by mouth daily. 08/11/21  Yes [provider]  metFORMIN (GLUCOPHAGE) 500 MG tablet Take 500 mg by mouth 2 (two) times daily. 08/13/21  Yes [provider]  nystatin cream (MYCOSTATIN) SMARTSIG:sparingly Topical 3 Times Daily 08/11/21  Yes [provider]  tamsulosin (FLOMAX) 0.4 MG CAPS capsule Take 1 capsule (0.4 mg total) by mouth daily after breakfast. 05/22/20  Yes Wurst, [place], PA-C    Allergies as of 08/15/2021 - Review Complete 05/22/2020  Allergen Reaction Noted   Penicillins Hives 05/22/2016    No family history on file.  Social History   Socioeconomic History   Marital status: Married    Spouse name: Not on file   Number of children: Not on file   Years of education:  Not on file   Highest education level: Not on file  Occupational History   Not on file  Tobacco Use   Smoking status: Every Day    Packs/day: 0.00    Types: Cigarettes   Smokeless tobacco: Never  Vaping Use   Vaping Use: Never used  Substance and Sexual Activity   Alcohol use: No   Drug use: No   Sexual activity: Not on file  Other Topics Concern   Not on file  Social History Narrative   Not on file   Social Determinants of Health   Financial Resource Strain: Not on file  Food Insecurity: Not on file  Transportation Needs: Not on file  Physical Activity: Not on file  Stress: Not on file  Social Connections: Not on file  Intimate Partner Violence: Not on file    Review of Systems: See HPI, otherwise negative ROS  Physical Exam: BP 120/83   Pulse 87   Temp (!) 96.1 F (35.6 C) (Temporal)   Resp 20   Ht '5\' 10"'$  (1.778 m)   Wt 97.5 kg   SpO2 100%   BMI 30.85 kg/m  General:   Alert,  pleasant and cooperative in NAD Head:  Normocephalic and atraumatic. Neck:  Supple; no masses or thyromegaly. Lungs:  Clear throughout to auscultation, normal respiratory effort.    Heart:  +S1, +S2, Regular rate and rhythm, No edema. Abdomen:  Soft, nontender  and nondistended. Normal bowel sounds, without guarding, and without rebound.   Neurologic:  Alert and  oriented x4;  grossly normal neurologically.  Impression/Plan: Amaad Byers is here for an colonoscopy to be performed for Screening colonoscopy average risk   Risks, benefits, limitations, and alternatives regarding  colonoscopy have been reviewed with the patient.  Questions have been answered.  All parties agreeable.   Jonathon Bellows, MD  09/29/2021, 10:07 AM

## 2021-09-29 NOTE — Anesthesia Procedure Notes (Signed)
Procedure Name: MAC Date/Time: 09/29/2021 10:14 AM  Performed by: Jerrye Noble, CRNAPre-anesthesia Checklist: Patient identified, Emergency Drugs available, Suction available and Patient being monitored Patient Re-evaluated:Patient Re-evaluated prior to induction Oxygen Delivery Method: Nasal cannula

## 2021-09-29 NOTE — Anesthesia Postprocedure Evaluation (Signed)
Anesthesia Post Note  Patient: Eric Mitchell  Procedure(s) Performed: COLONOSCOPy  Patient location during evaluation: Endoscopy Anesthesia Type: General Level of consciousness: awake and alert Pain management: pain level controlled Vital Signs Assessment: post-procedure vital signs reviewed and stable Respiratory status: spontaneous breathing, nonlabored ventilation and respiratory function stable Cardiovascular status: blood pressure returned to baseline and stable Postop Assessment: no apparent nausea or vomiting Anesthetic complications: no   No notable events documented.   Last Vitals:  Vitals:   09/29/21 0952 09/29/21 1030  BP: 120/83 (!) 85/53  Pulse: 87   Resp: 20   Temp: (!) 35.6 C (!) 35.6 C  SpO2: 100%     Last Pain:  Vitals:   09/29/21 1050  TempSrc:   PainSc: 0-No pain                 [place] Ouch

## 2021-09-30 ENCOUNTER — Encounter: Payer: Self-pay | Admitting: Gastroenterology

## 2021-09-30 LAB — SURGICAL PATHOLOGY

## 2022-05-13 LAB — HM DIABETES EYE EXAM

## 2022-05-25 NOTE — Progress Notes (Unsigned)
There were no vitals taken for this visit.   Subjective:    Patient ID: Eric Mitchell, male    DOB: [birth year], 47 y.o.   MRN: [id number]  HPI: Eric Mitchell is a 47 y.o. male  No chief complaint on file.  Establish care: his last physical was ***.  Medical history includes ***.  Family history includes ***.  Health Maintenance ***.   Relevant past medical, surgical, family and social history reviewed and updated as indicated. Interim medical history since our last visit reviewed. Allergies and medications reviewed and updated.  Review of Systems  Constitutional: Negative for fever or weight change.  Respiratory: Negative for cough and shortness of breath.   Cardiovascular: Negative for chest pain or palpitations.  Gastrointestinal: Negative for abdominal pain, no bowel changes.  Musculoskeletal: Negative for gait problem or joint swelling.  Skin: Negative for rash.  Neurological: Negative for dizziness or headache.  No other specific complaints in a complete review of systems (except as listed in HPI above).      Objective:    There were no vitals taken for this visit.  Wt Readings from Last 3 Encounters:  09/29/21 215 lb (97.5 kg)  08/19/21 230 lb (104.3 kg)  09/16/18 240 lb (108.9 kg)    Physical Exam  Constitutional: Patient appears well-developed and well-nourished. Obese *** No distress.  HEENT: head atraumatic, normocephalic, pupils equal and reactive to light, ears ***, neck supple, throat within normal limits Cardiovascular: Normal rate, regular rhythm and normal heart sounds.  No murmur heard. No BLE edema. Pulmonary/Chest: Effort normal and breath sounds normal. No respiratory distress. Abdominal: Soft.  There is no tenderness. Psychiatric: Patient has a normal mood and affect. behavior is normal. Judgment and thought content normal.  Results for orders placed or performed during the hospital encounter of 09/29/21  Glucose, capillary  Result Value Ref Range    Glucose-Capillary 152 (H) 70 - 99 mg/dL   Comment 1 IN EPIC   Surgical pathology  Result Value Ref Range   SURGICAL PATHOLOGY      SURGICAL PATHOLOGY CASE: [id number] PATIENT: Eric Mitchell Surgical Pathology Report     Specimen Submitted: A. Colon polyp, ascending; cold snare B. Colon polyp, sigmoid; cold snare  Clinical History: Screening colonoscopy.  Colon polyps      DIAGNOSIS: A.  COLON, DESCENDING, POLYP; BIOPSIES: - TUBULAR ADENOMA. - NO EVIDENCE OF HIGH-GRADE DYSPLASIA OR MALIGNANCY.  B.  COLON, SIGMOID, POLYP; BIOPSY: - SESSILE SERRATED POLYP.  - NO EVIDENCE OF HIGH-GRADE DYSPLASIA OR MALIGNANCY.  GROSS DESCRIPTION: A. Labeled: Cold snare ascending colon polyp Received: Formalin Collection time: 10:01 AM on 09/29/2021 Placed into formalin time: 10:01 AM on 09/29/2021 Tissue fragment(s): 2 Size: Range from 0.5-0.6 cm Description: Tan soft tissue fragments Entirely submitted in 1 cassette.  B. Labeled: Cold snare sigmoid colon polyp Received: Formalin Collection time: 10:24 AM on 09/29/2021 Placed into formalin time: 10:24 AM on 09/29/2021 Tissue fragment(s): 1 Size : 1 x 0.4 x 0.1 cm Description: Received is a single fragment of tan-white elongated soft tissue.  The resection margin is inked green and the fragment is trisected. Entirely submitted in 1 cassette.  RB 09/29/2021   Final Diagnosis performed by Alcario Drought, MD.   Electronically signed 09/30/2021 12:34:37PM The electronic signature indicates that the named Attending Pathologist has evaluated the specimen Technical component performed at [facility], [address], [place], [place] [address] Lab: [phone] Dir: Jolene Schimke, MD, MMM  Professional component performed at [facility], [facility],  [address] [place], [place], [place] [address] Lab: [phone] Dir: Beryle Quant, MD       Assessment & Plan:   Problem List Items Addressed This Visit   None    Follow  up plan: No follow-ups on file.

## 2022-05-26 ENCOUNTER — Encounter: Payer: Self-pay | Admitting: Nurse Practitioner

## 2022-05-26 ENCOUNTER — Ambulatory Visit: Payer: 59 | Admitting: Nurse Practitioner

## 2022-05-26 ENCOUNTER — Other Ambulatory Visit: Payer: Self-pay

## 2022-05-26 VITALS — BP 136/84 | HR 92 | Temp 97.9°F | Resp 16 | Ht 71.0 in | Wt 214.3 lb

## 2022-05-26 DIAGNOSIS — Z13220 Encounter for screening for lipoid disorders: Secondary | ICD-10-CM

## 2022-05-26 DIAGNOSIS — Z131 Encounter for screening for diabetes mellitus: Secondary | ICD-10-CM

## 2022-05-26 DIAGNOSIS — Z7689 Persons encountering health services in other specified circumstances: Secondary | ICD-10-CM

## 2022-05-26 DIAGNOSIS — Z7984 Long term (current) use of oral hypoglycemic drugs: Secondary | ICD-10-CM

## 2022-05-26 DIAGNOSIS — I10 Essential (primary) hypertension: Secondary | ICD-10-CM | POA: Diagnosis not present

## 2022-05-26 DIAGNOSIS — E782 Mixed hyperlipidemia: Secondary | ICD-10-CM | POA: Insufficient documentation

## 2022-05-26 DIAGNOSIS — Z114 Encounter for screening for human immunodeficiency virus [HIV]: Secondary | ICD-10-CM

## 2022-05-26 DIAGNOSIS — E1165 Type 2 diabetes mellitus with hyperglycemia: Secondary | ICD-10-CM | POA: Insufficient documentation

## 2022-05-26 DIAGNOSIS — Z1159 Encounter for screening for other viral diseases: Secondary | ICD-10-CM

## 2022-05-26 DIAGNOSIS — M5441 Lumbago with sciatica, right side: Secondary | ICD-10-CM

## 2022-05-26 MED ORDER — INSULIN PEN NEEDLE 32G X 6 MM MISC: 1.0000 | 0 refills | Status: DC

## 2022-05-26 MED ORDER — LOSARTAN POTASSIUM 100 MG PO TABS: 100.0000 mg | ORAL_TABLET | Freq: Every day | ORAL | 1 refills | Status: DC

## 2022-05-26 MED ORDER — METFORMIN HCL 500 MG PO TABS: 500.0000 mg | ORAL_TABLET | Freq: Two times a day (BID) | ORAL | 1 refills | Status: DC

## 2022-05-26 MED ORDER — OZEMPIC (0.25 OR 0.5 MG/DOSE) 2 MG/3ML ~~LOC~~ SOPN: 0.2500 mg | PEN_INJECTOR | SUBCUTANEOUS | 0 refills | Status: DC

## 2022-05-26 MED ORDER — PREDNISONE 10 MG (21) PO TBPK: ORAL_TABLET | ORAL | 0 refills | Status: DC

## 2022-05-26 MED ORDER — CYCLOBENZAPRINE HCL 5 MG PO TABS: 5.0000 mg | ORAL_TABLET | Freq: Three times a day (TID) | ORAL | 1 refills | Status: DC | PRN

## 2022-05-26 MED ORDER — ATORVASTATIN CALCIUM 20 MG PO TABS: 20.0000 mg | ORAL_TABLET | Freq: Every day | ORAL | 1 refills | Status: DC

## 2022-05-26 MED ORDER — NAPROXEN 500 MG PO TABS
500.0000 mg | ORAL_TABLET | Freq: Two times a day (BID) | ORAL | 0 refills | Status: DC
Start: 2022-05-26 — End: 2022-11-26

## 2022-05-26 NOTE — Assessment & Plan Note (Signed)
Restart losartan 100 mg daily.

## 2022-05-26 NOTE — Assessment & Plan Note (Signed)
Getting labs today, restart atorvastatin 20 mg daily

## 2022-05-26 NOTE — Assessment & Plan Note (Signed)
Restart metformin, start ozempic, getting labs today

## 2022-05-27 LAB — HEMOGLOBIN A1C
Hgb A1c MFr Bld: 13.4 % of total Hgb — ABNORMAL HIGH (ref <5.7)
Mean Plasma Glucose: 338 mg/dL
eAG (mmol/L): 18.7 mmol/L

## 2022-05-27 LAB — HEPATITIS C ANTIBODY: Hepatitis C Ab: NONREACTIVE

## 2022-05-27 LAB — COMPLETE METABOLIC PANEL WITH GFR
AG Ratio: 1.8 (calc) (ref 1.0–2.5)
ALT: 27 U/L (ref 9–46)
AST: 13 U/L (ref 10–40)
Albumin: 4.3 g/dL (ref 3.6–5.1)
Alkaline phosphatase (APISO): 88 U/L (ref 36–130)
BUN: 11 mg/dL (ref 7–25)
CO2: 28 mmol/L (ref 20–32)
Calcium: 9.5 mg/dL (ref 8.6–10.3)
Chloride: 95 mmol/L — ABNORMAL LOW (ref 98–110)
Creat: 1.05 mg/dL (ref 0.60–1.29)
Globulin: 2.4 g/dL (calc) (ref 1.9–3.7)
Glucose, Bld: 505 mg/dL (ref 65–99)
Potassium: 4.5 mmol/L (ref 3.5–5.3)
Sodium: 134 mmol/L — ABNORMAL LOW (ref 135–146)
Total Bilirubin: 0.5 mg/dL (ref 0.2–1.2)
Total Protein: 6.7 g/dL (ref 6.1–8.1)
eGFR: 88 mL/min/{1.73_m2} (ref >=60)

## 2022-05-27 LAB — CBC WITH DIFFERENTIAL/PLATELET
Absolute Monocytes: 257 cells/uL (ref 200–950)
Basophils Absolute: 18 cells/uL (ref 0–200)
Basophils Relative: 0.4 %
Eosinophils Absolute: 32 cells/uL (ref 15–500)
Eosinophils Relative: 0.7 %
HCT: 48.3 % (ref 38.5–50.0)
Hemoglobin: 16.3 g/dL (ref 13.2–17.1)
Lymphs Abs: 1926 cells/uL (ref 850–3900)
MCH: 31.7 pg (ref 27.0–33.0)
MCHC: 33.7 g/dL (ref 32.0–36.0)
MCV: 93.8 fL (ref 80.0–100.0)
MPV: 11.9 fL (ref 7.5–12.5)
Monocytes Relative: 5.7 %
Neutro Abs: 2268 cells/uL (ref 1500–7800)
Neutrophils Relative %: 50.4 %
Platelets: 261 10*3/uL (ref 140–400)
RBC: 5.15 10*6/uL (ref 4.20–5.80)
RDW: 11.6 % (ref 11.0–15.0)
Total Lymphocyte: 42.8 %
WBC: 4.5 10*3/uL (ref 3.8–10.8)

## 2022-05-27 LAB — MICROALBUMIN / CREATININE URINE RATIO
Creatinine, Urine: 55 mg/dL (ref 20–320)
Microalb Creat Ratio: 45 mg/g creat — ABNORMAL HIGH (ref <30)
Microalb, Ur: 2.5 mg/dL

## 2022-05-27 LAB — LIPID PANEL
Cholesterol: 205 mg/dL — ABNORMAL HIGH (ref <200)
HDL: 43 mg/dL (ref >=40)
LDL Cholesterol (Calc): 119 mg/dL (calc) — ABNORMAL HIGH
Non-HDL Cholesterol (Calc): 162 mg/dL (calc) — ABNORMAL HIGH (ref <130)
Total CHOL/HDL Ratio: 4.8 (calc) (ref <5.0)
Triglycerides: 298 mg/dL — ABNORMAL HIGH (ref <150)

## 2022-05-27 LAB — HIV ANTIBODY (ROUTINE TESTING W REFLEX): HIV 1&2 Ab, 4th Generation: NONREACTIVE

## 2022-06-03 ENCOUNTER — Ambulatory Visit: Payer: 59 | Admitting: Nurse Practitioner

## 2022-06-03 ENCOUNTER — Other Ambulatory Visit: Payer: Self-pay

## 2022-06-03 ENCOUNTER — Encounter: Payer: Self-pay | Admitting: Nurse Practitioner

## 2022-06-03 VITALS — BP 132/84 | HR 98 | Temp 97.8°F | Resp 18 | Ht 71.0 in | Wt 216.0 lb

## 2022-06-03 DIAGNOSIS — E1165 Type 2 diabetes mellitus with hyperglycemia: Secondary | ICD-10-CM

## 2022-06-03 DIAGNOSIS — Z7984 Long term (current) use of oral hypoglycemic drugs: Secondary | ICD-10-CM | POA: Diagnosis not present

## 2022-06-03 MED ORDER — BLOOD GLUCOSE MONITORING SUPPL DEVI
1.0000 | Freq: Three times a day (TID) | 0 refills | Status: AC
Start: 2022-06-03 — End: ?

## 2022-06-03 MED ORDER — LANCETS MISC. MISC
1 refills | Status: AC
Start: 2022-06-03 — End: 2022-07-03

## 2022-06-03 MED ORDER — LANTUS SOLOSTAR 100 UNIT/ML ~~LOC~~ SOPN
10.0000 [IU] | PEN_INJECTOR | Freq: Every day | SUBCUTANEOUS | 99 refills | Status: DC
Start: 2022-06-03 — End: 2022-08-27

## 2022-06-03 MED ORDER — BLOOD GLUCOSE TEST VI STRP
ORAL_STRIP | 1 refills | Status: AC
Start: 2022-06-03 — End: 2022-07-03

## 2022-06-03 NOTE — Assessment & Plan Note (Signed)
Continue taking tresiba 10 units day, ozempic 0.25 mg weekly and metformin 500 mg two times a day.  Check blood sugar every morning fasting and keep log. Bring to next appointment.

## 2022-06-03 NOTE — Progress Notes (Signed)
BP 132/84   Pulse 98   Temp 97.8 F (36.6 C) (Oral)   Resp 18   Ht 5\' 11"  (1.803 m)   Wt 216 lb (98 kg)   SpO2 98%   BMI 30.13 kg/m    Subjective:    Patient ID: Eric Mitchell, male    DOB: [birth year], 47 y.o.   MRN: [id number]  HPI: Eric Mitchell is a 47 y.o. male  Chief Complaint  Patient presents with   Diabetes    Follow up   Diabetes:  He has  been on metformin for a little over a year.  He was taking metformin 500 mg two times a day.  He says he has been out of medications for close to two months. He does not check his blood sugar.  He reports he has had polyuria and polydipsia.  Started patient on ozempic at last appointment, however after receiving his lab results it was necessary to start patient on insulin. His glucose was 505, his A1C was 13.4.  patient was started on tresiba 10 units daily. Patient did not have a glucometer and had not been checking his sugar. Will place order for glucometer.  He reports that he has had less polyuria or polydipsia.  Discussed with patient we will need to keep him on insulin for atleast a month til the ozempic starts to kick in and then we can try to slowly wean off. Patient agreed with plan of care.    Relevant past medical, surgical, family and social history reviewed and updated as indicated. Interim medical history since our last visit reviewed. Allergies and medications reviewed and updated.  Review of Systems  Constitutional: Negative for fever or weight change.  Respiratory: Negative for cough and shortness of breath.   Cardiovascular: Negative for chest pain or palpitations.  Gastrointestinal: Negative for abdominal pain, no bowel changes.  Musculoskeletal: Negative for gait problem or joint swelling.  Skin: Negative for rash.  Neurological: Negative for dizziness or headache.  No other specific complaints in a complete review of systems (except as listed in HPI above).      Objective:    BP 132/84   Pulse 98   Temp  97.8 F (36.6 C) (Oral)   Resp 18   Ht 5\' 11"  (1.803 m)   Wt 216 lb (98 kg)   SpO2 98%   BMI 30.13 kg/m   Wt Readings from Last 3 Encounters:  06/03/22 216 lb (98 kg)  05/26/22 214 lb 4.8 oz (97.2 kg)  09/29/21 215 lb (97.5 kg)    Physical Exam  Constitutional: Patient appears well-developed and well-nourished.  No distress.  HEENT: head atraumatic, normocephalic, pupils equal and reactive to light, neck supple Cardiovascular: Normal rate, regular rhythm and normal heart sounds.  No murmur heard. No BLE edema. Pulmonary/Chest: Effort normal and breath sounds normal. No respiratory distress. Abdominal: Soft.  There is no tenderness. Psychiatric: Patient has a normal mood and affect. behavior is normal. Judgment and thought content normal.  Results for orders placed or performed in visit on 05/26/22  CBC with Differential/Platelet  Result Value Ref Range   WBC 4.5 3.8 - 10.8 Thousand/uL   RBC 5.15 4.20 - 5.80 Million/uL   Hemoglobin 16.3 13.2 - 17.1 g/dL   HCT 40.9 81.1 - 91.4 %   MCV 93.8 80.0 - 100.0 fL   MCH 31.7 27.0 - 33.0 pg   MCHC 33.7 32.0 - 36.0 g/dL   RDW 78.2 95.6 - 21.3 %  Platelets 261 140 - 400 Thousand/uL   MPV 11.9 7.5 - 12.5 fL   Neutro Abs 2,268 1,500 - 7,800 cells/uL   Lymphs Abs 1,926 850 - 3,900 cells/uL   Absolute Monocytes 257 200 - 950 cells/uL   Eosinophils Absolute 32 15 - 500 cells/uL   Basophils Absolute 18 0 - 200 cells/uL   Neutrophils Relative % 50.4 %   Total Lymphocyte 42.8 %   Monocytes Relative 5.7 %   Eosinophils Relative 0.7 %   Basophils Relative 0.4 %  COMPLETE METABOLIC PANEL WITH GFR  Result Value Ref Range   Glucose, Bld 505 (HH) 65 - 99 mg/dL   BUN 11 7 - 25 mg/dL   Creat 2.95 6.21 - 3.08 mg/dL   eGFR 88 > OR = 60 MV/HQI/6.96E9   BUN/Creatinine Ratio SEE NOTE: 6 - 22 (calc)   Sodium 134 (L) 135 - 146 mmol/L   Potassium 4.5 3.5 - 5.3 mmol/L   Chloride 95 (L) 98 - 110 mmol/L   CO2 28 20 - 32 mmol/L   Calcium 9.5 8.6 - 10.3  mg/dL   Total Protein 6.7 6.1 - 8.1 g/dL   Albumin 4.3 3.6 - 5.1 g/dL   Globulin 2.4 1.9 - 3.7 g/dL (calc)   AG Ratio 1.8 1.0 - 2.5 (calc)   Total Bilirubin 0.5 0.2 - 1.2 mg/dL   Alkaline phosphatase (APISO) 88 36 - 130 U/L   AST 13 10 - 40 U/L   ALT 27 9 - 46 U/L  Lipid panel  Result Value Ref Range   Cholesterol 205 (H) <200 mg/dL   HDL 43 > OR = 40 mg/dL   Triglycerides 528 (H) <150 mg/dL   LDL Cholesterol (Calc) 119 (H) mg/dL (calc)   Total CHOL/HDL Ratio 4.8 <5.0 (calc)   Non-HDL Cholesterol (Calc) 162 (H) <130 mg/dL (calc)  Hepatitis C antibody  Result Value Ref Range   Hepatitis C Ab NON-REACTIVE NON-REACTIVE  Hemoglobin A1c  Result Value Ref Range   Hgb A1c MFr Bld 13.4 (H) <5.7 % of total Hgb   Mean Plasma Glucose 338 mg/dL   eAG (mmol/L) 41.3 mmol/L  HIV Antibody (routine testing w rflx)  Result Value Ref Range   HIV 1&2 Ab, 4th Generation NON-REACTIVE NON-REACTIVE  Microalbumin / creatinine urine ratio  Result Value Ref Range   Creatinine, Urine 55 20 - 320 mg/dL   Microalb, Ur 2.5 mg/dL   Microalb Creat Ratio 45 (H) <30 mg/g creat      Assessment & Plan:   Problem List Items Addressed This Visit       Endocrine   Type 2 diabetes mellitus with hyperglycemia, without long-term current use of insulin (HCC) - Primary    Continue taking tresiba 10 units day, ozempic 0.25 mg weekly and metformin 500 mg two times a day.  Check blood sugar every morning fasting and keep log. Bring to next appointment.       Relevant Medications   insulin glargine (LANTUS SOLOSTAR) 100 UNIT/ML Solostar Pen   Blood Glucose Monitoring Suppl DEVI   Glucose Blood (BLOOD GLUCOSE TEST STRIPS) STRP   Lancets Misc. MISC   Other Relevant Orders   COMPLETE METABOLIC PANEL WITH GFR     Follow up plan: Return for follow up, has appointment scheduled.

## 2022-06-04 LAB — COMPLETE METABOLIC PANEL WITH GFR
AG Ratio: 2.1 (calc) (ref 1.0–2.5)
ALT: 22 U/L (ref 9–46)
AST: 11 U/L (ref 10–40)
Albumin: 4.4 g/dL (ref 3.6–5.1)
Alkaline phosphatase (APISO): 72 U/L (ref 36–130)
BUN: 16 mg/dL (ref 7–25)
CO2: 30 mmol/L (ref 20–32)
Calcium: 9.6 mg/dL (ref 8.6–10.3)
Chloride: 96 mmol/L — ABNORMAL LOW (ref 98–110)
Creat: 0.93 mg/dL (ref 0.60–1.29)
Globulin: 2.1 g/dL (calc) (ref 1.9–3.7)
Glucose, Bld: 377 mg/dL — ABNORMAL HIGH (ref 65–99)
Potassium: 4.4 mmol/L (ref 3.5–5.3)
Sodium: 134 mmol/L — ABNORMAL LOW (ref 135–146)
Total Bilirubin: 0.4 mg/dL (ref 0.2–1.2)
Total Protein: 6.5 g/dL (ref 6.1–8.1)
eGFR: 101 mL/min/{1.73_m2} (ref >=60)

## 2022-06-05 ENCOUNTER — Encounter: Payer: Self-pay | Admitting: Nurse Practitioner

## 2022-08-26 ENCOUNTER — Other Ambulatory Visit: Payer: Self-pay

## 2022-08-26 ENCOUNTER — Ambulatory Visit: Payer: 59 | Admitting: Nurse Practitioner

## 2022-08-26 ENCOUNTER — Encounter: Payer: Self-pay | Admitting: Nurse Practitioner

## 2022-08-26 VITALS — BP 128/84 | HR 88 | Temp 98.0°F | Resp 16 | Ht 71.0 in | Wt 220.7 lb

## 2022-08-26 DIAGNOSIS — Z23 Encounter for immunization: Secondary | ICD-10-CM | POA: Diagnosis not present

## 2022-08-26 DIAGNOSIS — I10 Essential (primary) hypertension: Secondary | ICD-10-CM

## 2022-08-26 DIAGNOSIS — E1165 Type 2 diabetes mellitus with hyperglycemia: Secondary | ICD-10-CM | POA: Diagnosis not present

## 2022-08-26 DIAGNOSIS — E782 Mixed hyperlipidemia: Secondary | ICD-10-CM

## 2022-08-26 DIAGNOSIS — Z7984 Long term (current) use of oral hypoglycemic drugs: Secondary | ICD-10-CM

## 2022-08-26 LAB — POCT GLYCOSYLATED HEMOGLOBIN (HGB A1C): Hemoglobin A1C: 8.7 % — AB (ref 4.0–5.6)

## 2022-08-26 MED ORDER — OZEMPIC (0.25 OR 0.5 MG/DOSE) 2 MG/3ML ~~LOC~~ SOPN
0.5000 mg | PEN_INJECTOR | SUBCUTANEOUS | 1 refills | Status: DC
Start: 2022-08-26 — End: 2022-11-26

## 2022-08-26 NOTE — Assessment & Plan Note (Signed)
Continue working on lifestyle modification.  Continue taking losartan 100 mg daily

## 2022-08-26 NOTE — Assessment & Plan Note (Signed)
Continue atorvastatin, continue to work on lifestyle modification

## 2022-08-26 NOTE — Progress Notes (Signed)
BP 128/84   Pulse 88   Temp 98 F (36.7 C) (Oral)   Resp 16   Ht 5\' 11"  (1.803 m)   Wt 220 lb 11.2 oz (100.1 kg)   SpO2 98%   BMI 30.78 kg/m    Subjective:    Patient ID: Eric Mitchell, male    DOB: August 17, 1975, 47 y.o.   MRN: [id number]  HPI: Eric Mitchell is a 47 y.o. male  Chief Complaint  Patient presents with   Diabetes   Hypertension   Hyperlipidemia    4 month follow up   Diabetes, Type 2:  -Last A1c 13.4, down to 8.7 today -Medications: tresiba 10 units daily, metformin 500 mg BID, ozempic 0.25 mg weekly -Patient is compliant with the above medications and reports no side effects.  -Checking BG at home: yes -Fasting home BG: 189 this am -Highest home BG since last visit: 189 -Lowest home BG since last visit: 107 -Diet: has not been eating very good lately, soda and cookies -Exercise: he reports that he is walking alot -Eye exam: utd -Foot exam: utd -Microalbumin: utd -Statin: yes -PNA vaccine: none -Denies symptoms of hypoglycemia, polyuria, polydipsia, numbness extremities, foot ulcers/trauma.    Hypertension:  -Medications: losartan 100 mg daily -Patient is compliant with above medications and reports no side effects. -Checking BP at home (average): yes, has been in normal range -Denies any SOB, CP, vision changes, LE edema or symptoms of hypotension -Diet: recommend DASH diet  -Exercise: recommend 150 min of physical activity weekly        08/26/2022    7:51 AM 06/03/2022    1:24 PM 05/26/2022    1:14 PM  Vitals with BMI  Height 5\' 11"  5\' 11"  5\' 11"   Weight 220 lbs 11 oz 216 lbs 214 lbs 5 oz  BMI 30.8 30.14 29.9  Systolic 128 132 657  Diastolic 84 84 84  Pulse 88 98 92    HLD:  -Medications: atorvastatin -Patient is compliant with above medications and reports no side effects.  -Last lipid panel:  Lipid Panel     Component Value Date/Time   CHOL 205 (H) 05/26/2022 1353   TRIG 298 (H) 05/26/2022 1353   HDL 43 05/26/2022 1353   CHOLHDL  4.8 05/26/2022 1353   LDLCALC 119 (H) 05/26/2022 1353      Relevant past medical, surgical, family and social history reviewed and updated as indicated. Interim medical history since our last visit reviewed. Allergies and medications reviewed and updated.  Review of Systems  Constitutional: Negative for fever or weight change.  Respiratory: Negative for cough and shortness of breath.   Cardiovascular: Negative for chest pain or palpitations.  Gastrointestinal: Negative for abdominal pain, no bowel changes.  Musculoskeletal: Negative for gait problem or joint swelling. Positive for right side low back pain Skin: Negative for rash.  Neurological: Negative for dizziness or headache.  No other specific complaints in a complete review of systems (except as listed in HPI above).      Objective:    BP 128/84   Pulse 88   Temp 98 F (36.7 C) (Oral)   Resp 16   Ht 5\' 11"  (1.803 m)   Wt 220 lb 11.2 oz (100.1 kg)   SpO2 98%   BMI 30.78 kg/m   Wt Readings from Last 3 Encounters:  08/26/22 220 lb 11.2 oz (100.1 kg)  06/03/22 216 lb (98 kg)  05/26/22 214 lb 4.8 oz (97.2 kg)    Physical Exam  Constitutional: Patient appears well-developed and well-nourished.  No distress.  HEENT: head atraumatic, normocephalic, pupils equal and reactive to light, neck supple Cardiovascular: Normal rate, regular rhythm and normal heart sounds.  No murmur heard. No BLE edema. Pulmonary/Chest: Effort normal and breath sounds normal. No respiratory distress. Abdominal: Soft.  There is no tenderness. MSK: full range of motion, tenderness in right lower back and right thigh Psychiatric: Patient has a normal mood and affect. behavior is normal. Judgment and thought content normal.  Results for orders placed or performed in visit on 08/26/22  POCT HgB A1C  Result Value Ref Range   Hemoglobin A1C 8.7 (A) 4.0 - 5.6 %   HbA1c POC (<> result, manual entry)     HbA1c, POC (prediabetic range)     HbA1c, POC  (controlled diabetic range)        Assessment & Plan:   Problem List Items Addressed This Visit       Cardiovascular and Mediastinum   Essential hypertension    Continue working on lifestyle modification.  Continue taking losartan 100 mg daily        Endocrine   Type 2 diabetes mellitus with hyperglycemia, without long-term current use of insulin (HCC) - Primary    Increase ozempic to 0.5 mg weekly, continue lantus 10 units daily. Continue to monitor blood sugar.  Continue to work on lifestyle modification      Relevant Medications   Semaglutide,0.25 or 0.5MG /DOS, (OZEMPIC, 0.25 OR 0.5 MG/DOSE,) 2 MG/3ML SOPN   Other Relevant Orders   POCT HgB A1C (Completed)     Other   Mixed hyperlipidemia    Continue atorvastatin, continue to work on lifestyle modification      Other Visit Diagnoses     Need for Tdap vaccination       Relevant Orders   Tdap vaccine greater than or equal to 7yo IM         Follow up plan: Return in about 3 months (around 11/26/2022) for follow up.

## 2022-08-26 NOTE — Assessment & Plan Note (Signed)
Increase ozempic to 0.5 mg weekly, continue lantus 10 units daily. Continue to monitor blood sugar.  Continue to work on lifestyle modification

## 2022-08-27 ENCOUNTER — Other Ambulatory Visit: Payer: Self-pay | Admitting: Nurse Practitioner

## 2022-08-27 DIAGNOSIS — E1165 Type 2 diabetes mellitus with hyperglycemia: Secondary | ICD-10-CM

## 2022-08-27 NOTE — Telephone Encounter (Signed)
Medication Refill - Medication: insulin glargine (LANTUS SOLOSTAR) 100 UNIT/ML  Pt is out of this med  Has the patient contacted their pharmacy? yes (Agent: If no, request that the patient contact the pharmacy for the refill. If patient does not wish to contact the pharmacy document the reason why and proceed with request.) (Agent: If yes, when and what did the pharmacy advise?)contact pcp  Preferred Pharmacy (with phone number or street name):  [id number] - [place], [place] - [address] AT [facility] OF [address] [facility] Phone: [phone]  Fax: [phone]     Has the patient been seen for an appointment in the last year OR does the patient have an upcoming appointment? yes  Agent: Please be advised that RX refills may take up to 3 business days. We ask that you follow-up with your pharmacy.

## 2022-08-28 MED ORDER — LANTUS SOLOSTAR 100 UNIT/ML ~~LOC~~ SOPN
10.0000 [IU] | PEN_INJECTOR | Freq: Every day | SUBCUTANEOUS | 0 refills | Status: DC
Start: 2022-08-28 — End: 2022-11-26

## 2022-08-28 NOTE — Telephone Encounter (Signed)
Requested Prescriptions  Pending Prescriptions Disp Refills   insulin glargine (LANTUS SOLOSTAR) 100 UNIT/ML Solostar Pen 15 mL PRN    Sig: Inject 10 Units into the skin daily.     Endocrinology:  Diabetes - Insulins Failed - 08/27/2022 11:21 AM      Failed - HBA1C is between 0 and 7.9 and within 180 days    Hemoglobin A1C  Date Value Ref Range Status  08/26/2022 8.7 (A) 4.0 - 5.6 % Final   Hgb A1c MFr Bld  Date Value Ref Range Status  05/26/2022 13.4 (H) <5.7 % of total Hgb Final    Comment:    For someone without known diabetes, a hemoglobin A1c value of 6.5% or greater indicates that they may have  diabetes and this should be confirmed with a follow-up  test. . For someone with known diabetes, a value <7% indicates  that their diabetes is well controlled and a value  greater than or equal to 7% indicates suboptimal  control. A1c targets should be individualized based on  duration of diabetes, age, comorbid conditions, and  other considerations. . Currently, no consensus exists regarding use of hemoglobin A1c for diagnosis of diabetes for children. Verna Czech - Valid encounter within last 6 months    Recent Outpatient Visits           2 days ago Type 2 diabetes mellitus with hyperglycemia, without long-term current use of insulin [facility])   [place] [facility] Della Goo F, FNP   2 months ago Type 2 diabetes mellitus with hyperglycemia, without long-term current use of insulin [facility])   [place] [facility] Della Goo F, FNP   3 months ago Type 2 diabetes mellitus with hyperglycemia, without long-term current use of insulin [facility])   [place] [facility] Berniece Salines, FNP   8 years ago Laceration of hand, right, subsequent encounter   [facility], Tessa Lerner, MD   8 years ago Dehiscence of closure of skin, initial encounter   [facility], Tessa Lerner, MD       Future  Appointments             In 3 months Zane Herald, Rudolpho Sevin, FNP [facility], [facility]

## 2022-11-26 ENCOUNTER — Encounter: Payer: Self-pay | Admitting: Nurse Practitioner

## 2022-11-26 ENCOUNTER — Ambulatory Visit: Payer: 59 | Admitting: Nurse Practitioner

## 2022-11-26 ENCOUNTER — Other Ambulatory Visit: Payer: Self-pay

## 2022-11-26 VITALS — BP 132/84 | HR 89 | Temp 97.7°F | Resp 16 | Ht 71.0 in | Wt 231.6 lb

## 2022-11-26 DIAGNOSIS — E1165 Type 2 diabetes mellitus with hyperglycemia: Secondary | ICD-10-CM

## 2022-11-26 DIAGNOSIS — Z23 Encounter for immunization: Secondary | ICD-10-CM

## 2022-11-26 DIAGNOSIS — I10 Essential (primary) hypertension: Secondary | ICD-10-CM

## 2022-11-26 DIAGNOSIS — Z7985 Long-term (current) use of injectable non-insulin antidiabetic drugs: Secondary | ICD-10-CM

## 2022-11-26 DIAGNOSIS — E782 Mixed hyperlipidemia: Secondary | ICD-10-CM | POA: Diagnosis not present

## 2022-11-26 DIAGNOSIS — Z7984 Long term (current) use of oral hypoglycemic drugs: Secondary | ICD-10-CM

## 2022-11-26 MED ORDER — OZEMPIC (0.25 OR 0.5 MG/DOSE) 2 MG/3ML ~~LOC~~ SOPN: 0.5000 mg | PEN_INJECTOR | SUBCUTANEOUS | 1 refills | Status: DC

## 2022-11-26 MED ORDER — ATORVASTATIN CALCIUM 20 MG PO TABS: 20.0000 mg | ORAL_TABLET | Freq: Every day | ORAL | 1 refills | Status: DC

## 2022-11-26 MED ORDER — LANTUS SOLOSTAR 100 UNIT/ML ~~LOC~~ SOPN: 10.0000 [IU] | PEN_INJECTOR | Freq: Every day | SUBCUTANEOUS | 0 refills | Status: DC

## 2022-11-26 MED ORDER — LOSARTAN POTASSIUM 100 MG PO TABS: 100.0000 mg | ORAL_TABLET | Freq: Every day | ORAL | 1 refills | Status: DC

## 2022-11-26 MED ORDER — METFORMIN HCL 500 MG PO TABS: 500.0000 mg | ORAL_TABLET | Freq: Two times a day (BID) | ORAL | 1 refills | Status: DC

## 2022-11-26 NOTE — Progress Notes (Signed)
BP 132/84   Pulse 89   Temp 97.7 F (36.5 C) (Oral)   Resp 16   Ht 5\' 11"  (1.803 m)   Wt 231 lb 9.6 oz (105.1 kg)   SpO2 99%   BMI 32.30 kg/m    Subjective:    Patient ID: Eric Mitchell, male    DOB: 11/23/1975, 47 y.o.   MRN: [id number]  HPI: Eric Mitchell is a 47 y.o. male  Chief Complaint  Patient presents with   Medical Management of Chronic Issues   Diabetes, Type 2:  -Last A1c 8.7 -Medications:  tresiba 10 units daily, metformin 500 mg BID, ozempic 0.25 mg weekly -Patient is compliant with the above medications and reports no side effects.  -Checking BG at home: yes -Fasting home BG: 103-140 -Diet: reduce sugar and processed foods in your diet  -Exercise: recommend 150 min of physical activity weekly   -Eye exam: utd -Foot exam: utd -Microalbumin: utd -Statin: yes -PNA vaccine: no -Denies symptoms of hypoglycemia, polyuria, polydipsia, numbness extremities, foot ulcers/trauma.   - reports he has been eating more candy lately because of halloween  Hypertension:  -Medications: losartan 100 mg daily -Patient is compliant with above medications and reports no side effects. -Checking BP at home (average): occasionally checks,he says that he does not recall reading but not high -Denies any SOB, CP, vision changes, LE edema or symptoms of hypotension -Diet: recommend DASH diet  -Exercise: recommend 150 min of physical activity weekly        11/26/2022    7:32 AM 08/26/2022    7:51 AM 06/03/2022    1:24 PM  Vitals with BMI  Height 5\' 11"  5\' 11"  5\' 11"   Weight 231 lbs 10 oz 220 lbs 11 oz 216 lbs  BMI 32.32 30.8 30.14  Systolic 132 128 409  Diastolic 84 84 84  Pulse 89 88 98    HLD:  -Medications: atorvastatin 10 mg daily -Patient is compliant with above medications and reports no side effects.  -Last lipid panel: Lipid Panel     Component Value Date/Time   CHOL 205 (H) 05/26/2022 1353   TRIG 298 (H) 05/26/2022 1353   HDL 43 05/26/2022 1353   CHOLHDL  4.8 05/26/2022 1353   LDLCALC 119 (H) 05/26/2022 1353    The 10-year ASCVD risk score (Arnett DK, et al., 2019) is: 27.6%   Values used to calculate the score:     Age: 60 years     Sex: Male     Is Non-Hispanic African American: Yes     Diabetic: Yes     Tobacco smoker: Yes     Systolic Blood Pressure: 132 mmHg     Is BP treated: Yes     HDL Cholesterol: 43 mg/dL     Total Cholesterol: 205 mg/dL   Relevant past medical, surgical, family and social history reviewed and updated as indicated. Interim medical history since our last visit reviewed. Allergies and medications reviewed and updated.  Review of Systems  Constitutional: Negative for fever or weight change.  Respiratory: Negative for cough and shortness of breath.   Cardiovascular: Negative for chest pain or palpitations.  Gastrointestinal: Negative for abdominal pain, no bowel changes.  Musculoskeletal: Negative for gait problem or joint swelling. Positive for right side low back pain Skin: Negative for rash.  Neurological: Negative for dizziness or headache.  No other specific complaints in a complete review of systems (except as listed in HPI above).      Objective:  BP 132/84   Pulse 89   Temp 97.7 F (36.5 C) (Oral)   Resp 16   Ht 5\' 11"  (1.803 m)   Wt 231 lb 9.6 oz (105.1 kg)   SpO2 99%   BMI 32.30 kg/m   Wt Readings from Last 3 Encounters:  11/26/22 231 lb 9.6 oz (105.1 kg)  08/26/22 220 lb 11.2 oz (100.1 kg)  06/03/22 216 lb (98 kg)    Physical Exam  Constitutional: Patient appears well-developed and well-nourished.  No distress.  HEENT: head atraumatic, normocephalic, pupils equal and reactive to light, neck supple Cardiovascular: Normal rate, regular rhythm and normal heart sounds.  No murmur heard. No BLE edema. Pulmonary/Chest: Effort normal and breath sounds normal. No respiratory distress. Abdominal: Soft.  There is no tenderness. MSK: full range of motion, tenderness in right lower back and  right thigh Psychiatric: Patient has a normal mood and affect. behavior is normal. Judgment and thought content normal.  Results for orders placed or performed in visit on 08/26/22  POCT HgB A1C  Result Value Ref Range   Hemoglobin A1C 8.7 (A) 4.0 - 5.6 %   HbA1c POC (<> result, manual entry)     HbA1c, POC (prediabetic range)     HbA1c, POC (controlled diabetic range)        Assessment & Plan:   Problem List Items Addressed This Visit       Cardiovascular and Mediastinum   Essential hypertension    Continue taking losartan 100 mg daily      Relevant Medications   losartan (COZAAR) 100 MG tablet   atorvastatin (LIPITOR) 20 MG tablet   Other Relevant Orders   CBC with Differential/Platelet   COMPLETE METABOLIC PANEL WITH GFR     Endocrine   Type 2 diabetes mellitus with hyperglycemia, without long-term current use of insulin (HCC) - Primary    Continue taking Tresiba 10 units daily, metformin 500 mg twice daily and Ozempic 0.25 mg weekly.      Relevant Medications   metFORMIN (GLUCOPHAGE) 500 MG tablet   losartan (COZAAR) 100 MG tablet   insulin glargine (LANTUS SOLOSTAR) 100 UNIT/ML Solostar Pen   atorvastatin (LIPITOR) 20 MG tablet   Semaglutide,0.25 or 0.5MG /DOS, (OZEMPIC, 0.25 OR 0.5 MG/DOSE,) 2 MG/3ML SOPN   Other Relevant Orders   COMPLETE METABOLIC PANEL WITH GFR   Hemoglobin A1c     Other   Mixed hyperlipidemia    Continue taking atorvastatin 10 mg daily.      Relevant Medications   losartan (COZAAR) 100 MG tablet   atorvastatin (LIPITOR) 20 MG tablet   Other Relevant Orders   COMPLETE METABOLIC PANEL WITH GFR   Lipid panel   Other Visit Diagnoses     Need for influenza vaccination       Relevant Orders   Flu vaccine trivalent PF, 6mos and older(Flulaval,Afluria,Fluarix,Fluzone) (Completed)          Follow up plan: Return in about 6 months (around 05/26/2023) for follow up.

## 2022-11-26 NOTE — Assessment & Plan Note (Signed)
Continue taking losartan 100 mg daily

## 2022-11-26 NOTE — Assessment & Plan Note (Signed)
Continue taking atorvastatin 10 mg daily 

## 2022-11-26 NOTE — Assessment & Plan Note (Signed)
Continue taking Tresiba 10 units daily, metformin 500 mg twice daily and Ozempic 0.25 mg weekly.

## 2022-11-27 LAB — CBC WITH DIFFERENTIAL/PLATELET
Absolute Lymphocytes: 1971 {cells}/uL (ref 850–3900)
Absolute Monocytes: 437 {cells}/uL (ref 200–950)
Basophils Absolute: 9 {cells}/uL (ref 0–200)
Basophils Relative: 0.2 %
Eosinophils Absolute: 81 {cells}/uL (ref 15–500)
Eosinophils Relative: 1.8 %
HCT: 45.5 % (ref 38.5–50.0)
Hemoglobin: 15.5 g/dL (ref 13.2–17.1)
MCH: 31.8 pg (ref 27.0–33.0)
MCHC: 34.1 g/dL (ref 32.0–36.0)
MCV: 93.2 fL (ref 80.0–100.0)
MPV: 10.8 fL (ref 7.5–12.5)
Monocytes Relative: 9.7 %
Neutro Abs: 2003 {cells}/uL (ref 1500–7800)
Neutrophils Relative %: 44.5 %
Platelets: 276 10*3/uL (ref 140–400)
RBC: 4.88 10*6/uL (ref 4.20–5.80)
RDW: 12.2 % (ref 11.0–15.0)
Total Lymphocyte: 43.8 %
WBC: 4.5 10*3/uL (ref 3.8–10.8)

## 2022-11-27 LAB — LIPID PANEL
Cholesterol: 143 mg/dL (ref <200)
HDL: 49 mg/dL (ref >=40)
LDL Cholesterol (Calc): 72 mg/dL
Non-HDL Cholesterol (Calc): 94 mg/dL (ref <130)
Total CHOL/HDL Ratio: 2.9 (calc) (ref <5.0)
Triglycerides: 140 mg/dL (ref <150)

## 2022-11-27 LAB — COMPLETE METABOLIC PANEL WITH GFR
AG Ratio: 1.9 (calc) (ref 1.0–2.5)
ALT: 37 U/L (ref 9–46)
AST: 18 U/L (ref 10–40)
Albumin: 4.4 g/dL (ref 3.6–5.1)
Alkaline phosphatase (APISO): 77 U/L (ref 36–130)
BUN: 11 mg/dL (ref 7–25)
CO2: 31 mmol/L (ref 20–32)
Calcium: 9.5 mg/dL (ref 8.6–10.3)
Chloride: 102 mmol/L (ref 98–110)
Creat: 0.89 mg/dL (ref 0.60–1.29)
Globulin: 2.3 g/dL (ref 1.9–3.7)
Glucose, Bld: 158 mg/dL — ABNORMAL HIGH (ref 65–99)
Potassium: 5 mmol/L (ref 3.5–5.3)
Sodium: 139 mmol/L (ref 135–146)
Total Bilirubin: 0.4 mg/dL (ref 0.2–1.2)
Total Protein: 6.7 g/dL (ref 6.1–8.1)
eGFR: 105 mL/min/{1.73_m2} (ref >=60)

## 2022-11-27 LAB — HEMOGLOBIN A1C
Hgb A1c MFr Bld: 7.5 %{Hb} — ABNORMAL HIGH (ref <5.7)
Mean Plasma Glucose: 169 mg/dL
eAG (mmol/L): 9.3 mmol/L

## 2023-03-26 LAB — HM DIABETES EYE EXAM

## 2023-05-05 ENCOUNTER — Other Ambulatory Visit: Payer: Self-pay | Admitting: Nurse Practitioner

## 2023-05-05 DIAGNOSIS — E1165 Type 2 diabetes mellitus with hyperglycemia: Secondary | ICD-10-CM

## 2023-05-06 NOTE — Telephone Encounter (Signed)
 Requested Prescriptions  Pending Prescriptions Disp Refills   Semaglutide,0.25 or 0.5MG /DOS, (OZEMPIC, 0.25 OR 0.5 MG/DOSE,) 2 MG/3ML SOPN [Pharmacy Med Name: OZEMPIC 0.25 OR 0.5MG  DOS(2MG /3ML)] 3 mL 0    Sig: INJECT 0.5MG  INTO THE SKIN ONCE A WEEK     Endocrinology:  Diabetes - GLP-1 Receptor Agonists - semaglutide Failed - 05/06/2023  4:15 PM      Failed - HBA1C in normal range and within 180 days    Hgb A1c MFr Bld  Date Value Ref Range Status  11/26/2022 7.5 (H) <5.7 % of total Hgb Final    Comment:    For someone without known diabetes, a hemoglobin A1c value of 6.5% or greater indicates that they may have  diabetes and this should be confirmed with a follow-up  test. . For someone with known diabetes, a value <7% indicates  that their diabetes is well controlled and a value  greater than or equal to 7% indicates suboptimal  control. A1c targets should be individualized based on  duration of diabetes, age, comorbid conditions, and  other considerations. . Currently, no consensus exists regarding use of hemoglobin A1c for diagnosis of diabetes for children. .          Failed - Valid encounter within last 6 months    Recent Outpatient Visits           8 years ago Laceration of hand, right, subsequent encounter   [facility], Gaylord Kearns, MD   8 years ago Dehiscence of closure of skin, initial encounter   [facility], Gaylord Kearns, MD   8 years ago Hand laceration, right, initial encounter   [facility], [facility]       Future Appointments             In 2 weeks Abram Hoguet Monalisa Angles, FNP [place] [facility]            Passed - Cr in normal range and within 360 days    Creat  Date Value Ref Range Status  11/26/2022 0.89 0.60 - 1.29 mg/dL Final   Creatinine, Urine  Date Value Ref Range Status  05/26/2022 55 20 - 320 mg/dL Final

## 2023-05-26 ENCOUNTER — Other Ambulatory Visit: Payer: Self-pay

## 2023-05-26 ENCOUNTER — Ambulatory Visit: Payer: Self-pay | Admitting: Nurse Practitioner

## 2023-05-26 ENCOUNTER — Encounter: Payer: Self-pay | Admitting: Nurse Practitioner

## 2023-05-26 VITALS — BP 128/72 | HR 81 | Temp 98.2°F | Resp 16 | Ht 71.0 in | Wt 229.6 lb

## 2023-05-26 DIAGNOSIS — R0683 Snoring: Secondary | ICD-10-CM

## 2023-05-26 DIAGNOSIS — E1165 Type 2 diabetes mellitus with hyperglycemia: Secondary | ICD-10-CM | POA: Diagnosis not present

## 2023-05-26 DIAGNOSIS — Z794 Long term (current) use of insulin: Secondary | ICD-10-CM

## 2023-05-26 DIAGNOSIS — I10 Essential (primary) hypertension: Secondary | ICD-10-CM

## 2023-05-26 DIAGNOSIS — Z7984 Long term (current) use of oral hypoglycemic drugs: Secondary | ICD-10-CM

## 2023-05-26 DIAGNOSIS — E782 Mixed hyperlipidemia: Secondary | ICD-10-CM | POA: Diagnosis not present

## 2023-05-26 LAB — POCT GLYCOSYLATED HEMOGLOBIN (HGB A1C): Hemoglobin A1C: 10.2 % — AB (ref 4.0–5.6)

## 2023-05-26 MED ORDER — ATORVASTATIN CALCIUM 20 MG PO TABS: 20.0000 mg | ORAL_TABLET | Freq: Every day | ORAL | 1 refills | Status: DC

## 2023-05-26 MED ORDER — SEMAGLUTIDE (1 MG/DOSE) 4 MG/3ML ~~LOC~~ SOPN: 1.0000 mg | PEN_INJECTOR | SUBCUTANEOUS | 1 refills | Status: DC

## 2023-05-26 MED ORDER — LANTUS SOLOSTAR 100 UNIT/ML ~~LOC~~ SOPN: 10.0000 [IU] | PEN_INJECTOR | Freq: Every day | SUBCUTANEOUS | 1 refills | Status: DC

## 2023-05-26 MED ORDER — METFORMIN HCL 500 MG PO TABS: 500.0000 mg | ORAL_TABLET | Freq: Two times a day (BID) | ORAL | 1 refills | Status: DC

## 2023-05-26 NOTE — Progress Notes (Signed)
 BP 128/72 (Cuff Size: Large)   Pulse 81   Temp 98.2 F (36.8 C) (Oral)   Resp 16   Ht 5\' 11"  (1.803 m)   Wt 229 lb 9.6 oz (104.1 kg)   SpO2 99%   BMI 32.02 kg/m    Subjective:    Patient ID: Rexford Catchings, male    DOB: 09/02/1975, 48 y.o.   MRN: [id number]  HPI: Koichi Crocitto is a 48 y.o. male  Chief Complaint  Patient presents with   Medical Management of Chronic Issues    6 month recheck    Discussed the use of AI scribe software for clinical note transcription with the patient, who gave verbal consent to proceed.  History of Present Illness Mercer Lomax is a 48 year old male with hypertension, type two diabetes, and hyperlipidemia who presents for a six month follow-up.  He has a history of hypertension, type two diabetes, and hyperlipidemia. His last A1c was 7.5, but it has now increased to 10.2. He monitors his blood sugar at home, which has been consistently in the 200s. He stopped smoking two months ago but started vaping, which he noticed increased his blood sugar levels, so he stopped vaping as well.  He is currently on atorvastatin  20 mg daily, Lantus  10 units daily, losartan  100 mg daily, metformin  500 mg twice a day, and Ozempic  0.5 mg weekly. He mistakenly thought he was taking 2 mg of Ozempic  weekly. He has one pen of Ozempic  left at home and two more refills available. He has one pen of Lantus  left and buys needles from [employer]. He continues to take his cholesterol medication and metformin  as prescribed.  He has sleep disturbances, as noted by his wife, who says he snores and sometimes stops breathing during sleep. He wakes up at 3 or 4 AM and cannot go back to sleep. He sometimes feels rested upon waking, but it varies.  He had an eye exam in February or early March of this year at an eye doctor on [address] He was put in multifocal contacts but does not like them. He is unsure if a diabetic eye exam was performed.  He has already completed a urine test for  microalbumin during this visit.         11/26/2022    7:44 AM 08/26/2022    7:52 AM 06/03/2022    1:25 PM  Depression screen PHQ 2/9  Decreased Interest 0 0 0  Down, Depressed, Hopeless 0 0 0  PHQ - 2 Score 0 0 0    Relevant past medical, surgical, family and social history reviewed and updated as indicated. Interim medical history since our last visit reviewed. Allergies and medications reviewed and updated.  Review of Systems  Constitutional: Negative for fever or weight change.  Respiratory: Negative for cough and shortness of breath.   Cardiovascular: Negative for chest pain or palpitations.  Gastrointestinal: Negative for abdominal pain, no bowel changes.  Musculoskeletal: Negative for gait problem or joint swelling.  Skin: Negative for rash.  Neurological: Negative for dizziness or headache.  No other specific complaints in a complete review of systems (except as listed in HPI above).      Objective:    BP 128/72 (Cuff Size: Large)   Pulse 81   Temp 98.2 F (36.8 C) (Oral)   Resp 16   Ht 5\' 11"  (1.803 m)   Wt 229 lb 9.6 oz (104.1 kg)   SpO2 99%   BMI 32.02 kg/m  Wt Readings from Last 3 Encounters:  05/26/23 229 lb 9.6 oz (104.1 kg)  11/26/22 231 lb 9.6 oz (105.1 kg)  08/26/22 220 lb 11.2 oz (100.1 kg)    Physical Exam Physical Exam VITALS: BP- 128/17 GENERAL: Alert, cooperative, well developed, no acute distress. HEENT: Normocephalic, normal oropharynx, moist mucous membranes. CHEST: Clear to auscultation bilaterally, no wheezes, rhonchi, or crackles. CARDIOVASCULAR: Normal heart rate and rhythm, S1 and S2 normal without murmurs. ABDOMEN: Soft, non-tender, non-distended, without organomegaly, normal bowel sounds. EXTREMITIES: No cyanosis or edema. NEUROLOGICAL: Cranial nerves grossly intact, moves all extremities without gross motor or sensory deficit, sensation symmetric and intact.   Diabetic Foot Exam - Simple   Simple Foot Form Diabetic Foot exam  was performed with the following findings: Yes 05/26/2023  7:59 AM  Visual Inspection No deformities, no ulcerations, no other skin breakdown bilaterally: Yes Sensation Testing Intact to touch and monofilament testing bilaterally: Yes Pulse Check Posterior Tibialis and Dorsalis pulse intact bilaterally: Yes Comments     Results for orders placed or performed in visit on 05/26/23  POCT HgB A1C   Collection Time: 05/26/23  7:47 AM  Result Value Ref Range   Hemoglobin A1C 10.2 (A) 4.0 - 5.6 %   HbA1c POC (<> result, manual entry)     HbA1c, POC (prediabetic range)     HbA1c, POC (controlled diabetic range)         Assessment & Plan:   Problem List Items Addressed This Visit       Cardiovascular and Mediastinum   Essential hypertension   Relevant Medications   atorvastatin  (LIPITOR) 20 MG tablet     Endocrine   Type 2 diabetes mellitus with hyperglycemia, without long-term current use of insulin  (HCC) - Primary   Relevant Medications   Semaglutide , 1 MG/DOSE, 4 MG/3ML SOPN   atorvastatin  (LIPITOR) 20 MG tablet   insulin  glargine (LANTUS  SOLOSTAR) 100 UNIT/ML Solostar Pen   metFORMIN  (GLUCOPHAGE ) 500 MG tablet   Other Relevant Orders   POCT HgB A1C (Completed)   Microalbumin / creatinine urine ratio   HM Diabetes Foot Exam (Completed)     Other   Mixed hyperlipidemia   Relevant Medications   atorvastatin  (LIPITOR) 20 MG tablet   Other Visit Diagnoses       Snores       Relevant Orders   Ambulatory referral to Pulmonology        Assessment and Plan Assessment & Plan Hypertension Blood pressure is well controlled at 128/17 mmHg with current medication regimen.  Type 2 diabetes mellitus with hyperglycemia A1c has increased to 10.2%, indicating poor glycemic control. Blood glucose levels at home have been running in the 200s. Current regimen includes metformin , Lantus , and Ozempic . He has been taking 0.5 mg - Increase Ozempic  to 1 mg weekly after finishing current  pen. - Increase Lantus  by 2 units every third day if blood sugar is greater than 150 mg/dL. - Consider reducing insulin  if blood sugar is consistently below 150 mg/dL after Ozempic  adjustment. - Order microalbumin urine test. - Schedule follow-up in 3 months to reassess A1c and blood sugar control.  Hyperlipidemia Cholesterol levels are well controlled with current atorvastatin  regimen.  Suspected sleep apnea Reports snoring and possible apneic episodes as noted by spouse. Symptoms suggestive of sleep apnea, including waking up tired and difficulty returning to sleep after waking early. Discussed risks of untreated sleep apnea, including increased risk of strokes and heart attacks due to lack of restful sleep. - Refer  to pulmonology for home sleep study.        Follow up plan: Return in about 3 months (around 08/26/2023) for follow up.

## 2023-05-26 NOTE — Patient Instructions (Signed)
 Increase lantus  by 2 units every 3rd day for blood sugar greater than 150  If less than 150 can decrease by 2 units

## 2023-05-27 LAB — MICROALBUMIN / CREATININE URINE RATIO
Creatinine, Urine: 106 mg/dL (ref 20–320)
Microalb Creat Ratio: 61 mg/g{creat} — ABNORMAL HIGH (ref <30)
Microalb, Ur: 6.5 mg/dL

## 2023-06-01 ENCOUNTER — Encounter: Payer: Self-pay | Admitting: Sleep Medicine

## 2023-06-01 ENCOUNTER — Ambulatory Visit: Admitting: Sleep Medicine

## 2023-06-01 VITALS — BP 118/80 | HR 94 | Temp 98.4°F | Ht 71.0 in | Wt 231.6 lb

## 2023-06-01 DIAGNOSIS — I10 Essential (primary) hypertension: Secondary | ICD-10-CM

## 2023-06-01 DIAGNOSIS — Z87891 Personal history of nicotine dependence: Secondary | ICD-10-CM

## 2023-06-01 DIAGNOSIS — Z6832 Body mass index (BMI) 32.0-32.9, adult: Secondary | ICD-10-CM

## 2023-06-01 DIAGNOSIS — G4733 Obstructive sleep apnea (adult) (pediatric): Secondary | ICD-10-CM

## 2023-06-01 DIAGNOSIS — R0681 Apnea, not elsewhere classified: Secondary | ICD-10-CM

## 2023-06-01 DIAGNOSIS — R0683 Snoring: Secondary | ICD-10-CM

## 2023-06-01 DIAGNOSIS — E66811 Obesity, class 1: Secondary | ICD-10-CM | POA: Diagnosis not present

## 2023-06-01 NOTE — Patient Instructions (Signed)
 Eric Mitchell

## 2023-06-01 NOTE — Progress Notes (Signed)
 Name:Eric Mitchell MRN: [id number] DOB: 01-04-1976   CHIEF COMPLAINT:  EXCESSIVE DAYTIME SLEEPINESS   HISTORY OF PRESENT ILLNESS:  Eric Mitchell is a 48 y.o. w/ a h/o HTN, DMII, hyperlipidemia and obesity who presents for c/o loud snoring, witnessed apnea and excessive daytime sleepiness which has been present for several years. Reports nocturnal awakenings due to unclear reasons and has difficulty falling back to sleep. Denies any significant weight changes. Denies morning headaches, RLS symptoms, dream enactment, cataplexy, hypnagogic or hypnapompic hallucinations. Denies a family history of sleep apnea. Reports drowsy driving. Drinks 3 cups of coffee and 1 soda or tea daily, denies alcohol or illicit drug use, former smoker.   Bedtime 9-10 pm Sleep onset 15-30 mins Rise time 5 am   EPWORTH SLEEP SCORE 6    06/01/2023    1:00 PM  Results of the Epworth flowsheet  Sitting and reading 1  Watching TV 1  Sitting, inactive in a public place (e.g. a theatre or a meeting) 0  As a passenger in a car for an hour without a break 0  Lying down to rest in the afternoon when circumstances permit 3  Sitting and talking to someone 0  Sitting quietly after a lunch without alcohol 0  In a car, while stopped for a few minutes in traffic 1  Total score 6    PAST MEDICAL HISTORY :   has a past medical history of Diabetes mellitus without complication (HCC), Hyperlipidemia, Hypertension, Migraines, and Wears contact lenses.  has a past surgical history that includes Colonoscopy (N/A, 09/29/2021). Prior to Admission medications   Medication Sig Start Date End Date Taking? Authorizing Provider  atorvastatin  (LIPITOR) 20 MG tablet Take 1 tablet (20 mg total) by mouth daily. 05/26/23   Pender, Julie F, FNP  Blood Glucose Monitoring Suppl DEVI 1 each by Does not apply route in the morning, at noon, and at bedtime. May substitute to any manufacturer covered by patient's insurance. Patient not  taking: Reported on 05/26/2023 06/03/22   Quinton Buckler, FNP  insulin  glargine (LANTUS  SOLOSTAR) 100 UNIT/ML Solostar Pen Inject 10 Units into the skin daily. 05/26/23   Pender, Julie F, FNP  Insulin  Pen Needle 32G X 6 MM MISC 1 each by Does not apply route once a week. Patient not taking: Reported on 05/26/2023 05/26/22   Quinton Buckler, FNP  losartan  (COZAAR ) 100 MG tablet Take 1 tablet (100 mg total) by mouth daily. 11/26/22   Pender, Julie F, FNP  metFORMIN  (GLUCOPHAGE ) 500 MG tablet Take 1 tablet (500 mg total) by mouth 2 (two) times daily with a meal. 05/26/23   Quinton Buckler, FNP  Semaglutide , 1 MG/DOSE, 4 MG/3ML SOPN Inject 1 mg as directed once a week. 05/26/23   Quinton Buckler, FNP   Allergies  Allergen Reactions   Penicillins Hives    FAMILY HISTORY:  family history includes Alcohol abuse in his father; Diabetes in his mother; Hyperlipidemia in his mother; Hypertension in his mother. SOCIAL HISTORY:  reports that he quit smoking about 4 months ago. His smoking use included cigarettes. He has never used smokeless tobacco. He reports that he does not drink alcohol and does not use drugs.   Review of Systems:  Gen:  Denies  fever, sweats, chills weight loss  HEENT: Denies blurred vision, double vision, ear pain, eye pain, hearing loss, nose bleeds, sore throat Cardiac:  No dizziness, chest pain or heaviness, chest tightness,edema, No JVD Resp:  No cough, -sputum production, -shortness of breath,-wheezing, -hemoptysis,  Gi: Denies swallowing difficulty, stomach pain, nausea or vomiting, diarrhea, constipation, bowel incontinence Gu:  Denies bladder incontinence, burning urine Ext:   Denies Joint pain, stiffness or swelling Skin: Denies  skin rash, easy bruising or bleeding or hives Endoc:  Denies polyuria, polydipsia , polyphagia or weight change Psych:   Denies depression, insomnia or hallucinations  Other:  All other systems negative  VITAL SIGNS: BP 118/80 (BP Location: Right  Arm, Patient Position: Sitting, Cuff Size: Large)   Pulse 94   Temp 98.4 F (36.9 C) (Oral)   Ht 5\' 11"  (1.803 m)   Wt 231 lb 9.6 oz (105.1 kg)   SpO2 94%   BMI 32.30 kg/m    Physical Examination:   General Appearance: No distress  EYES PERRLA, EOM intact.   NECK Supple, No JVD Pulmonary: normal breath sounds, No wheezing.  CardiovascularNormal S1,S2.  No m/r/g.   Abdomen: Benign, Soft, non-tender. Skin:   warm, no rashes, no ecchymosis  Extremities: normal, no cyanosis, clubbing. Neuro:without focal findings,  speech normal  PSYCHIATRIC: Mood, affect within normal limits.   ASSESSMENT AND PLAN  OSA I suspect that OSA is likely present due to clinical presentation. Discussed the consequences of untreated sleep apnea. Advised not to drive drowsy for safety of patient and others. Will complete further evaluation with a home sleep study and follow up to review results.    HTN Stable, on current management. Following with PCP.   Obesity Counseled patient on diet and lifestyle modification.    MEDICATION ADJUSTMENTS/LABS AND TESTS ORDERED: Recommend Sleep Study   Patient  satisfied with Plan of action and management. All questions answered  Follow up to review HST results and treatment plan.   I spent a total of 48 minutes reviewing chart data, face-to-face evaluation with the patient, counseling and coordination of care as detailed above.    Eric Mitchell, M.D.  Sleep Medicine [facility]

## 2023-06-27 ENCOUNTER — Other Ambulatory Visit: Payer: Self-pay | Admitting: Nurse Practitioner

## 2023-06-27 DIAGNOSIS — E782 Mixed hyperlipidemia: Secondary | ICD-10-CM

## 2023-06-29 NOTE — Telephone Encounter (Signed)
 Too soon for refill, refilled 05/26/23 for 90 and 1 rf.  Requested Prescriptions  Pending Prescriptions Disp Refills   atorvastatin  (LIPITOR) 20 MG tablet [Pharmacy Med Name: ATORVASTATIN  20MG  TABLETS] 90 tablet 1    Sig: TAKE 1 TABLET(20 MG) BY MOUTH DAILY     Cardiovascular:  Antilipid - Statins Failed - 06/29/2023  8:39 AM      Failed - Lipid Panel in normal range within the last 12 months    Cholesterol  Date Value Ref Range Status  11/26/2022 143 <200 mg/dL Final   LDL Cholesterol (Calc)  Date Value Ref Range Status  11/26/2022 72 mg/dL (calc) Final    Comment:    Reference range: <100 . Desirable range <100 mg/dL for primary prevention;   <70 mg/dL for patients with CHD or diabetic patients  with > or = 2 CHD risk factors. Aaron Aas LDL-C is now calculated using the Martin-Hopkins  calculation, which is a validated novel method providing  better accuracy than the Friedewald equation in the  estimation of LDL-C.  Melinda Sprawls et al. Erroll Heard. 0981;191(47): 2061-2068  (http://education.[employer].com/faq/FAQ164)    HDL  Date Value Ref Range Status  11/26/2022 49 > OR = 40 mg/dL Final   Triglycerides  Date Value Ref Range Status  11/26/2022 140 <150 mg/dL Final         Passed - Patient is not pregnant      Passed - Valid encounter within last 12 months    Recent Outpatient Visits           1 month ago Type 2 diabetes mellitus with hyperglycemia, without long-term current use of insulin  [facility])   [facility] Quinton Buckler, FNP   8 years ago Laceration of hand, right, subsequent encounter   [facility], Gaylord Kearns, MD   8 years ago Dehiscence of closure of skin, initial encounter   [facility], Gaylord Kearns, MD   8 years ago Hand laceration, right, initial encounter   [facility] at [facility], [facility]

## 2023-08-25 ENCOUNTER — Encounter: Payer: Self-pay | Admitting: Nurse Practitioner

## 2023-08-25 ENCOUNTER — Ambulatory Visit: Admitting: Nurse Practitioner

## 2023-08-25 ENCOUNTER — Ambulatory Visit: Payer: Self-pay

## 2023-08-25 VITALS — BP 152/98 | HR 88 | Resp 16 | Ht 71.0 in | Wt 229.2 lb

## 2023-08-25 DIAGNOSIS — Z794 Long term (current) use of insulin: Secondary | ICD-10-CM | POA: Diagnosis not present

## 2023-08-25 DIAGNOSIS — E1165 Type 2 diabetes mellitus with hyperglycemia: Secondary | ICD-10-CM | POA: Diagnosis not present

## 2023-08-25 DIAGNOSIS — I10 Essential (primary) hypertension: Secondary | ICD-10-CM

## 2023-08-25 DIAGNOSIS — E782 Mixed hyperlipidemia: Secondary | ICD-10-CM | POA: Diagnosis not present

## 2023-08-25 LAB — POCT GLYCOSYLATED HEMOGLOBIN (HGB A1C): Hemoglobin A1C: 9.1 % — AB (ref 4.0–5.6)

## 2023-08-25 MED ORDER — LANTUS SOLOSTAR 100 UNIT/ML ~~LOC~~ SOPN: 10.0000 [IU] | PEN_INJECTOR | Freq: Every day | SUBCUTANEOUS | 3 refills | Status: AC

## 2023-08-25 MED ORDER — ATORVASTATIN CALCIUM 20 MG PO TABS: 20.0000 mg | ORAL_TABLET | Freq: Every day | ORAL | 1 refills | Status: DC

## 2023-08-25 MED ORDER — OZEMPIC (2 MG/DOSE) 8 MG/3ML ~~LOC~~ SOPN: 2.0000 mg | PEN_INJECTOR | SUBCUTANEOUS | 1 refills | Status: DC

## 2023-08-25 MED ORDER — LOSARTAN POTASSIUM 100 MG PO TABS: 100.0000 mg | ORAL_TABLET | Freq: Every day | ORAL | 1 refills | Status: DC

## 2023-08-25 NOTE — Telephone Encounter (Signed)
  FYI Only or Action Required?: FYI only for provider. : pt going to urgent care due to no available openings at PCP office today.  Patient was last seen in primary care on 08/25/2023 by Gareth Mliss FALCON, FNP.  Called Nurse Triage reporting Insect Bite.  Symptoms began today.  Interventions attempted: Nothing.  Symptoms are: gradually worsening.  Triage Disposition: See Physician Within 24 Hours  Patient/caregiver understands and will follow disposition?: Yes   Copied from CRM #[id number]. Topic: Clinical - Red Word Triage >> Aug 25, 2023  2:10 PM Yolanda T wrote: [facility] that prompted transfer to Nurse Triage: patient was stung on his face near his lips on the right side. Reason for Disposition  [1] Red or very tender (to touch) area AND [2] started over 24 hours after the sting    Pt wanted to be seen today to get something to help with the pain.  Answer Assessment - Initial Assessment Questions 1. TYPE: What type of sting was it? (e.g., bee, yellow jacket, unknown)      wasp 2. ONSET: When did it occur?      today 3. LOCATION: Where is the sting located?  How many stings?     Right side of face near lips 4. SWELLING SIZE: How big is the swelling? (e.g., inches or cm)     Mild to moderate 5. REDNESS: Is the area red or pink? If Yes, ask: What size is area of redness? (e.g., inches or cm). When did the redness start?     yes 6. PAIN: Is there any pain? If Yes, ask: How bad is it?  (Scale 0-10; or none, mild, moderate, severe)     8/10 7. ITCHING: Is there any itching? If Yes, ask: How bad is it?      Yes - moderate just itching at sting mark 8. RESPIRATORY DISTRESS: Describe your breathing.     no 9. PRIOR REACTIONS: Have you had any severe allergic reactions to stings in the past? If Yes, ask: What happened?    Urgent care last time prescribed prednisone  - oral and administered shot into buttock. 10. OTHER SYMPTOMS: Do you have any other  symptoms? (e.g., abdomen pain, face or tongue swelling, new rash elsewhere, vomiting)       no 11. PREGNANCY: Is there any chance you are pregnant? When was your last menstrual period?       Na  Pt really wanted to be seen today: informed no openings with any provider today but openings for tomorrow: pt stated he would just go to urgent care.  Protocols used: Bee or Yellow Jacket Sting-A-AH

## 2023-08-25 NOTE — Progress Notes (Signed)
 BP (!) 152/98   Pulse 88   Resp 16   Ht 5' 11 (1.803 m)   Wt 229 lb 3.2 oz (104 kg)   SpO2 100%   BMI 31.97 kg/m    Subjective:    Patient ID: Eric Mitchell, male    DOB: 01-Feb-1975, 48 y.o.   MRN: [id number]  HPI: Eric Mitchell is a 48 y.o. male  Chief Complaint  Patient presents with   Medical Management of Chronic Issues   Headache    On going for 3 weeks    Discussed the use of AI scribe software for clinical note transcription with the patient, who gave verbal consent to proceed.  History of Present Illness Eric Mitchell is a 48 year old male with hypertension, type two diabetes, and hyperlipidemia who presents for a three-month follow-up.  Hyperglycemia and diabetes management - Type 2 diabetes with most recent hemoglobin A1c improved from 10.2 to 9.1 - Blood glucose levels generally around 150 mg/dL with occasional spikes - Currently taking Lantus  10 units daily and Ozempic  1 mg weekly - Not taking metformin  as prescribed  Hypertension - Chronic hypertension with current blood pressure of 164/100 mmHg - Out of losartan  and no refills available  Hyperlipidemia - Continues atorvastatin  20 mg daily for lipid management  Headache and migraine symptoms - Mild migraines associated with elevated blood pressure - Uses a cap full of vinegar for headache relief with some improvement, but finds it difficult to consume         08/25/2023    8:00 AM 11/26/2022    7:44 AM 08/26/2022    7:52 AM  Depression screen PHQ 2/9  Decreased Interest 0 0 0  Down, Depressed, Hopeless 0 0 0  PHQ - 2 Score 0 0 0    Relevant past medical, surgical, family and social history reviewed and updated as indicated. Interim medical history since our last visit reviewed. Allergies and medications reviewed and updated.  Review of Systems  Constitutional: Negative for fever or weight change.  Respiratory: Negative for cough and shortness of breath.   Cardiovascular: Negative for chest  pain or palpitations.  Gastrointestinal: Negative for abdominal pain, no bowel changes.  Musculoskeletal: Negative for gait problem or joint swelling.  Skin: Negative for rash.  Neurological: Negative for dizziness, positive headache.  No other specific complaints in a complete review of systems (except as listed in HPI above).      Objective:     BP (!) 152/98   Pulse 88   Resp 16   Ht 5' 11 (1.803 m)   Wt 229 lb 3.2 oz (104 kg)   SpO2 100%   BMI 31.97 kg/m    Wt Readings from Last 3 Encounters:  08/25/23 229 lb 3.2 oz (104 kg)  06/01/23 231 lb 9.6 oz (105.1 kg)  05/26/23 229 lb 9.6 oz (104.1 kg)    Physical Exam Physical Exam VITALS: BP- 164/100 GENERAL: Alert, cooperative, well developed, no acute distress. HEENT: Normocephalic, normal oropharynx, moist mucous membranes. CHEST: Clear to auscultation bilaterally, no wheezes, rhonchi, or crackles. CARDIOVASCULAR: Normal heart rate and rhythm, S1 and S2 normal without murmurs. ABDOMEN: Soft, non-tender, non-distended, without organomegaly, normal bowel sounds. EXTREMITIES: No cyanosis or edema. NEUROLOGICAL: Cranial nerves grossly intact, moves all extremities without gross motor or sensory deficit.   Results for orders placed or performed in visit on 08/25/23  POCT HgB A1C   Collection Time: 08/25/23  8:13 AM  Result Value Ref Range   Hemoglobin  A1C 9.1 (A) 4.0 - 5.6 %   HbA1c POC (<> result, manual entry)     HbA1c, POC (prediabetic range)     HbA1c, POC (controlled diabetic range)            Assessment & Plan:   Problem List Items Addressed This Visit       Cardiovascular and Mediastinum   Essential hypertension - Primary   Relevant Medications   losartan  (COZAAR ) 100 MG tablet   atorvastatin  (LIPITOR) 20 MG tablet     Endocrine   Type 2 diabetes mellitus with hyperglycemia, without long-term current use of insulin  (HCC)   Relevant Medications   losartan  (COZAAR ) 100 MG tablet   Semaglutide , 2  MG/DOSE, (OZEMPIC , 2 MG/DOSE,) 8 MG/3ML SOPN   insulin  glargine (LANTUS  SOLOSTAR) 100 UNIT/ML Solostar Pen   atorvastatin  (LIPITOR) 20 MG tablet   Other Relevant Orders   POCT HgB A1C (Completed)     Other   Mixed hyperlipidemia   Relevant Medications   losartan  (COZAAR ) 100 MG tablet   atorvastatin  (LIPITOR) 20 MG tablet     Assessment and Plan Assessment & Plan Type 2 diabetes mellitus Type 2 diabetes mellitus with suboptimal control. Hemoglobin A1c improved from 10.2% to 9.1% but remains above target. Blood glucose levels around 150 mg/dL with occasional spikes. Currently on Ozempic  and insulin  therapy. Non-adherence to metformin  noted. Discussed that Ozempic  and Wegovy  are the same drug, with different names for insurance purposes, and that increasing Ozempic  may aid in weight loss as well as better glycemic control. - Increase Ozempic  to 2 mg weekly - Continue Lantus  10 units daily  Hypertension Hypertension with current blood pressure reading of 164/100 mmHg, indicating poor control. Non-adherence to losartan  noted, which was previously effective in managing blood pressure. Discussed the importance of losartan  in protecting renal function, especially given the strain from diabetes and hypertension. - Restart losartan  100 mg daily  Hyperlipidemia Hyperlipidemia currently managed with atorvastatin  20 mg daily. - Continue atorvastatin  20 mg daily  Migraine associated with elevated blood pressure Mild migraine headaches associated with elevated blood pressure. Headaches improve with blood pressure control. Advised against using vinegar as a remedy due to potential choking hazard. - Monitor headache improvement with blood pressure control - Advise against using vinegar as a remedy due to potential choking hazard        Follow up plan: Return in about 3 months (around 11/25/2023) for follow up.

## 2023-08-26 ENCOUNTER — Ambulatory Visit: Admitting: Nurse Practitioner

## 2023-09-14 ENCOUNTER — Other Ambulatory Visit: Payer: Self-pay | Admitting: Nurse Practitioner

## 2023-09-14 DIAGNOSIS — E1165 Type 2 diabetes mellitus with hyperglycemia: Secondary | ICD-10-CM

## 2023-09-14 NOTE — Telephone Encounter (Unsigned)
 Copied from CRM #[id number]. Topic: Clinical - Medication Refill >> Sep 14, 2023  7:32 AM Tiffini S wrote: Medication: insulin  glargine (LANTUS  SOLOSTAR) 100 UNIT/ML Solostar Pen  Has the patient contacted their pharmacy? Yes (Agent: If no, request that the patient contact the pharmacy for the refill. If patient does not wish to contact the pharmacy document the reason why and proceed with request.) (Agent: If yes, when and what did the pharmacy advise?)  This is the patient's preferred pharmacy:  [id number] [place], [place] - [address] AT [facility] OF [address] [place] [address] [place] [address] Phone: [phone] Fax: [phone]  Is this the correct pharmacy for this prescription? Yes If no, delete pharmacy and type the correct one.   Has the prescription been filled recently? Yes  Is the patient out of the medication? Yes, last take on 09/11/23  Has the patient been seen for an appointment in the last year OR does the patient have an upcoming appointment? Yes  Can we respond through MyChart? No, please call patient at [phone]  Agent: Please be advised that Rx refills may take up to 3 business days. We ask that you follow-up with your pharmacy.

## 2023-09-15 NOTE — Telephone Encounter (Signed)
 Requested medication (s) are due for refill today: No  Requested medication (s) are on the active medication list: Yes  Last refill:  08/25/23  Future visit scheduled: Yes  Notes to clinic:  See note in chart, prescription lost.    Requested Prescriptions  Pending Prescriptions Disp Refills   insulin  glargine (LANTUS  SOLOSTAR) 100 UNIT/ML Solostar Pen 15 mL 3    Sig: Inject 10 Units into the skin daily.     Endocrinology:  Diabetes - Insulins Failed - 09/15/2023 12:35 PM      Failed - HBA1C is between 0 and 7.9 and within 180 days    Hemoglobin A1C  Date Value Ref Range Status  08/25/2023 9.1 (A) 4.0 - 5.6 % Final   Hgb A1c MFr Bld  Date Value Ref Range Status  11/26/2022 7.5 (H) <5.7 % of total Hgb Final    Comment:    For someone without known diabetes, a hemoglobin A1c value of 6.5% or greater indicates that they may have  diabetes and this should be confirmed with a follow-up  test. . For someone with known diabetes, a value <7% indicates  that their diabetes is well controlled and a value  greater than or equal to 7% indicates suboptimal  control. A1c targets should be individualized based on  duration of diabetes, age, comorbid conditions, and  other considerations. . Currently, no consensus exists regarding use of hemoglobin A1c for diagnosis of diabetes for children. SABRA Amy - Valid encounter within last 6 months    Recent Outpatient Visits           3 weeks ago Essential hypertension   [facility] Gareth Clarity F, FNP   3 months ago Type 2 diabetes mellitus with hyperglycemia, without long-term current use of insulin  [facility])   [facility] Gareth Clarity FALCON, FNP   9 years ago Laceration of hand, right, subsequent encounter   [facility], Juliane RAMAN, MD   9 years ago Dehiscence of closure of skin, initial encounter   [facility], Juliane RAMAN, MD   9 years ago  Hand laceration, right, initial encounter   [facility] Mario Million, MD       Future Appointments             In 2 months Gareth, Clarity FALCON, FNP [facility]

## 2023-11-25 ENCOUNTER — Encounter: Payer: Self-pay | Admitting: Nurse Practitioner

## 2023-11-25 ENCOUNTER — Ambulatory Visit (INDEPENDENT_AMBULATORY_CARE_PROVIDER_SITE_OTHER): Admitting: Nurse Practitioner

## 2023-11-25 VITALS — BP 152/102 | HR 82 | Temp 98.2°F | Ht 71.0 in | Wt 223.0 lb

## 2023-11-25 DIAGNOSIS — I10 Essential (primary) hypertension: Secondary | ICD-10-CM | POA: Diagnosis not present

## 2023-11-25 DIAGNOSIS — E1165 Type 2 diabetes mellitus with hyperglycemia: Secondary | ICD-10-CM

## 2023-11-25 DIAGNOSIS — R809 Proteinuria, unspecified: Secondary | ICD-10-CM

## 2023-11-25 DIAGNOSIS — E6609 Other obesity due to excess calories: Secondary | ICD-10-CM

## 2023-11-25 DIAGNOSIS — Z6831 Body mass index (BMI) 31.0-31.9, adult: Secondary | ICD-10-CM

## 2023-11-25 DIAGNOSIS — E782 Mixed hyperlipidemia: Secondary | ICD-10-CM

## 2023-11-25 DIAGNOSIS — Z23 Encounter for immunization: Secondary | ICD-10-CM

## 2023-11-25 DIAGNOSIS — E1129 Type 2 diabetes mellitus with other diabetic kidney complication: Secondary | ICD-10-CM | POA: Diagnosis not present

## 2023-11-25 DIAGNOSIS — E66811 Obesity, class 1: Secondary | ICD-10-CM

## 2023-11-25 DIAGNOSIS — Z794 Long term (current) use of insulin: Secondary | ICD-10-CM

## 2023-11-25 MED ORDER — INSULIN PEN NEEDLE 32G X 6 MM MISC: 1.0000 | 5 refills | Status: AC

## 2023-11-25 MED ORDER — AMLODIPINE BESYLATE 5 MG PO TABS: 5.0000 mg | ORAL_TABLET | Freq: Every day | ORAL | 0 refills | Status: DC

## 2023-11-25 NOTE — Progress Notes (Signed)
 BP (!) 152/102   Pulse 82   Temp 98.2 F (36.8 C)   Ht 5' 11 (1.803 m)   Wt 223 lb (101.2 kg)   SpO2 99%   BMI 31.10 kg/m    Subjective:    Patient ID: Eric Mitchell, male    DOB: 07-26-1975, 48 y.o.   MRN: [id number]  HPI: Eric Mitchell is a 48 y.o. male  Chief Complaint  Patient presents with   Medical Management of Chronic Issues   Discussed the use of AI scribe software for clinical note transcription with the patient, who gave verbal consent to proceed.  History of Present Illness Darious Mitchell is a 48 year old male with hypertension, type 2 diabetes, hyperlipidemia, and obesity who presents for a routine follow-up.  Glycemic control and insulin  administration - Type 2 diabetes managed with long-term insulin  therapy (Lantus  10 units daily) and Ozempic  2 mg weekly - Last hemoglobin A1c was 9.1 - Microalbuminuria present- on losartan  - Blood glucose levels generally around 120 mg/dL, with a recent reading of 130 mg/dL after dietary change - Requests refill of pen needles for insulin  administration - Continues to use [facility] for prescription fulfillment  Hypertension management - Hypertension managed with losartan  100 mg daily - Previous switch from olmesartan to losartan  due to adverse effects - No headaches, chest pain, or shortness of breath - Adheres to a low-salt diet and avoids fast food - Compliant with antihypertensive medication regimen BP Readings from Last 3 Encounters:  11/25/23 (!) 152/102  08/25/23 (!) 152/98  06/01/23 118/80     Hyperlipidemia - Hyperlipidemia managed with atorvastatin  20 mg daily Lipid Panel     Component Value Date/Time   CHOL 143 11/26/2022 0803   TRIG 140 11/26/2022 0803   HDL 49 11/26/2022 0803   CHOLHDL 2.9 11/26/2022 0803   LDLCALC 72 11/26/2022 0803     Obesity and weight trajectory - Weight decreased from 229 pounds (BMI 31.97) at last visit to 223 pounds (BMI 31.1) currently  Wt Readings from Last 3  Encounters:  11/25/23 223 lb (101.2 kg)  08/25/23 229 lb 3.2 oz (104 kg)  06/01/23 231 lb 9.6 oz (105.1 kg)    Body mass index is 31.1 kg/m.  Flowsheet Row Office Visit from 11/25/2023 in [facility] Office Visit from 08/25/2023 in [facility]  1 38 inches 42.5 inches   - Encourage continuation of lifestyle modifications, including dietary management and regular exercise. -continue to increase physical activity, getting at least 150 min of physical activity a week.  Work on including runner, broadcasting/film/video 2 days a week.  - continue eating at a calorie deficit 2000-2200 cal a day, eating a well balanced diet with whole foods, avoiding processed foods.   Patient is motivated to continue working on lifestyle modification.          08/25/2023    8:00 AM 11/26/2022    7:44 AM 08/26/2022    7:52 AM  Depression screen PHQ 2/9  Decreased Interest 0 0 0  Down, Depressed, Hopeless 0 0 0  PHQ - 2 Score 0 0 0    Relevant past medical, surgical, family and social history reviewed and updated as indicated. Interim medical history since our last visit reviewed. Allergies and medications reviewed and updated.  Review of Systems  Constitutional: Negative for fever or weight change.  Respiratory: Negative for cough and shortness of breath.   Cardiovascular: Negative for chest pain or palpitations.  Gastrointestinal:  Negative for abdominal pain, no bowel changes.  Musculoskeletal: Negative for gait problem or joint swelling.  Skin: Negative for rash.  Neurological: Negative for dizziness or headache.  No other specific complaints in a complete review of systems (except as listed in HPI above).      Objective:      BP (!) 152/102   Pulse 82   Temp 98.2 F (36.8 C)   Ht 5' 11 (1.803 m)   Wt 223 lb (101.2 kg)   SpO2 99%   BMI 31.10 kg/m    Wt Readings from Last 3 Encounters:  11/25/23 223 lb (101.2 kg)  08/25/23 229 lb 3.2 oz (104 kg)   06/01/23 231 lb 9.6 oz (105.1 kg)    Physical Exam VITALS: BP- 158/110 MEASUREMENTS: Weight- 223, BMI- 31.1. GENERAL: Alert, cooperative, well developed, no acute distress HEENT: Normocephalic, normal oropharynx, moist mucous membranes CHEST: Clear to auscultation bilaterally, no wheezes, rhonchi, or crackles CARDIOVASCULAR: Normal heart rate and rhythm, S1 and S2 normal without murmurs ABDOMEN: Soft, non-tender, non-distended, without organomegaly, normal bowel sounds EXTREMITIES: No cyanosis or edema NEUROLOGICAL: Cranial nerves grossly intact, moves all extremities without gross motor or sensory deficit  Results for orders placed or performed in visit on 08/25/23  HM DIABETES EYE EXAM   Collection Time: 03/26/23 10:06 AM  Result Value Ref Range   HM Diabetic Eye Exam No Retinopathy No Retinopathy          Assessment & Plan:   Problem List Items Addressed This Visit       Cardiovascular and Mediastinum   Essential hypertension - Primary   Relevant Medications   amLODipine (NORVASC) 5 MG tablet   Other Relevant Orders   CBC with Differential/Platelet   Comprehensive metabolic panel with GFR     Endocrine   Type 2 diabetes mellitus with hyperglycemia, without long-term current use of insulin  (HCC)   Relevant Medications   Insulin  Pen Needle 32G X 6 MM MISC     Other   Class 1 obesity due to excess calories with serious comorbidity and body mass index (BMI) of 31.0 to 31.9 in adult   Relevant Orders   TSH   Mixed hyperlipidemia   Relevant Medications   amLODipine (NORVASC) 5 MG tablet   Other Relevant Orders   Comprehensive metabolic panel with GFR   Lipid panel   Microalbuminuria   Relevant Orders   Comprehensive metabolic panel with GFR   Hemoglobin A1c   Other Visit Diagnoses       Immunization due       Relevant Orders   Flu vaccine trivalent PF, 6mos and older(Flulaval,Afluria,Fluarix,Fluzone) (Completed)        Assessment and Plan Assessment &  Plan Essential hypertension Hypertension remains uncontrolled with current regimen. Blood pressure today is 158/110 mmHg. Previous medication olmesartan was not well tolerated. Losartan  is currently used for blood pressure control and renal protection. Genetic predisposition and lifestyle factors contribute to hypertension. - Added amlodipine to current regimen to reduce cardiac workload and lower blood pressure. - Instructed to monitor for leg swelling as a side effect of amlodipine. - Instructed to send MyChart message with blood pressure readings after one week of starting amlodipine. - Rechecked blood pressure before leaving the office. - Scheduled follow-up in three months, or sooner if blood pressure remains uncontrolled.  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 Last A1c was 9.1%, indicating suboptimal glycemic control. Microalbuminuria present, indicating renal involvement. Current medications  include Lantus  and Ozempic . - Ordered CBC and CMP to assess overall health and renal function. - Continue current diabetes medications: Lantus  10 units daily and Ozempic  2 mg weekly. - Refilled insulin  pen needles.  Obesity, class 1 Class 1 obesity with recent weight of 223 pounds and BMI of 31.1. Weight has decreased slightly from previous visit.  Mixed hyperlipidemia Managed with atorvastatin  20 mg daily. - Continue atorvastatin  20 mg daily.        Follow up plan: Return in about 3 months (around 02/25/2024) for follow up.

## 2023-11-26 ENCOUNTER — Ambulatory Visit: Payer: Self-pay | Admitting: Nurse Practitioner

## 2023-11-26 LAB — COMPREHENSIVE METABOLIC PANEL WITH GFR
AG Ratio: 2 (calc) (ref 1.0–2.5)
ALT: 58 U/L — ABNORMAL HIGH (ref 9–46)
AST: 27 U/L (ref 10–35)
Albumin: 4.6 g/dL (ref 3.6–5.1)
Alkaline phosphatase (APISO): 58 U/L (ref 35–144)
BUN: 12 mg/dL (ref 7–25)
CO2: 30 mmol/L (ref 20–32)
Calcium: 9.6 mg/dL (ref 8.6–10.3)
Chloride: 104 mmol/L (ref 98–110)
Creat: 0.96 mg/dL (ref 0.70–1.30)
Globulin: 2.3 g/dL (ref 1.9–3.7)
Glucose, Bld: 99 mg/dL (ref 65–139)
Potassium: 5 mmol/L (ref 3.5–5.3)
Sodium: 140 mmol/L (ref 135–146)
Total Bilirubin: 0.4 mg/dL (ref 0.2–1.2)
Total Protein: 6.9 g/dL (ref 6.1–8.1)
eGFR: 96 mL/min/1.73m2 (ref >=60)

## 2023-11-26 LAB — CBC WITH DIFFERENTIAL/PLATELET
Absolute Lymphocytes: 1799 {cells}/uL (ref 850–3900)
Absolute Monocytes: 322 {cells}/uL (ref 200–950)
Basophils Absolute: 11 {cells}/uL (ref 0–200)
Basophils Relative: 0.3 %
Eosinophils Absolute: 39 {cells}/uL (ref 15–500)
Eosinophils Relative: 1.1 %
HCT: 43.6 % (ref 38.5–50.0)
Hemoglobin: 15.3 g/dL (ref 13.2–17.1)
MCH: 31.9 pg (ref 27.0–33.0)
MCHC: 35.1 g/dL (ref 32.0–36.0)
MCV: 90.8 fL (ref 80.0–100.0)
MPV: 10.5 fL (ref 7.5–12.5)
Monocytes Relative: 9.2 %
Neutro Abs: 1330 {cells}/uL — ABNORMAL LOW (ref 1500–7800)
Neutrophils Relative %: 38 %
Platelets: 276 Thousand/uL (ref 140–400)
RBC: 4.8 Million/uL (ref 4.20–5.80)
RDW: 12.5 % (ref 11.0–15.0)
Total Lymphocyte: 51.4 %
WBC: 3.5 Thousand/uL — ABNORMAL LOW (ref 3.8–10.8)

## 2023-11-26 LAB — LIPID PANEL
Cholesterol: 111 mg/dL (ref <200)
HDL: 48 mg/dL (ref >=40)
LDL Cholesterol (Calc): 50 mg/dL
Non-HDL Cholesterol (Calc): 63 mg/dL (ref <130)
Total CHOL/HDL Ratio: 2.3 (calc) (ref <5.0)
Triglycerides: 52 mg/dL (ref <150)

## 2023-11-26 LAB — TSH: TSH: 1.17 m[IU]/L (ref 0.40–4.50)

## 2023-11-26 LAB — HEMOGLOBIN A1C
Hgb A1c MFr Bld: 6.7 % — ABNORMAL HIGH (ref <5.7)
Mean Plasma Glucose: 146 mg/dL
eAG (mmol/L): 8.1 mmol/L

## 2024-02-21 ENCOUNTER — Other Ambulatory Visit: Payer: Self-pay | Admitting: Nurse Practitioner

## 2024-02-21 DIAGNOSIS — I10 Essential (primary) hypertension: Secondary | ICD-10-CM

## 2024-02-23 NOTE — Telephone Encounter (Signed)
 Requested Prescriptions  Pending Prescriptions Disp Refills   losartan  (COZAAR ) 100 MG tablet [Pharmacy Med Name: LOSARTAN  100MG  TABLETS] 90 tablet 0    Sig: TAKE 1 TABLET(100 MG) BY MOUTH DAILY     Cardiovascular:  Angiotensin Receptor Blockers Failed - 02/23/2024 10:57 AM      Failed - Last BP in normal range    BP Readings from Last 1 Encounters:  11/25/23 (!) 152/102         Passed - Cr in normal range and within 180 days    Creat  Date Value Ref Range Status  11/25/2023 0.96 0.70 - 1.30 mg/dL Final   Creatinine, Urine  Date Value Ref Range Status  05/26/2023 106 20 - 320 mg/dL Final         Passed - K in normal range and within 180 days    Potassium  Date Value Ref Range Status  11/25/2023 5.0 3.5 - 5.3 mmol/L Final         Passed - Patient is not pregnant      Passed - Valid encounter within last 6 months    Recent Outpatient Visits           3 months ago Essential hypertension   [place] [facility] Gareth Mliss FALCON, FNP   6 months ago Essential hypertension   [facility] Gareth Mliss F, FNP   9 months ago Type 2 diabetes mellitus with hyperglycemia, without long-term current use of insulin  [facility])   [facility] Gareth Mliss FALCON, FNP   9 years ago Laceration of hand, right, subsequent encounter   [facility], Juliane RAMAN, MD   9 years ago Dehiscence of closure of skin, initial encounter   [facility], Juliane RAMAN, MD

## 2024-02-25 ENCOUNTER — Ambulatory Visit (INDEPENDENT_AMBULATORY_CARE_PROVIDER_SITE_OTHER): Admitting: Nurse Practitioner

## 2024-02-25 ENCOUNTER — Encounter: Payer: Self-pay | Admitting: Nurse Practitioner

## 2024-02-25 VITALS — BP 124/88 | HR 83 | Temp 98.5°F | Ht 71.0 in | Wt 223.0 lb

## 2024-02-25 DIAGNOSIS — E66811 Obesity, class 1: Secondary | ICD-10-CM

## 2024-02-25 DIAGNOSIS — Z23 Encounter for immunization: Secondary | ICD-10-CM

## 2024-02-25 DIAGNOSIS — Z6831 Body mass index (BMI) 31.0-31.9, adult: Secondary | ICD-10-CM

## 2024-02-25 DIAGNOSIS — Z794 Long term (current) use of insulin: Secondary | ICD-10-CM

## 2024-02-25 DIAGNOSIS — N529 Male erectile dysfunction, unspecified: Secondary | ICD-10-CM

## 2024-02-25 DIAGNOSIS — D72819 Decreased white blood cell count, unspecified: Secondary | ICD-10-CM

## 2024-02-25 DIAGNOSIS — R809 Proteinuria, unspecified: Secondary | ICD-10-CM

## 2024-02-25 DIAGNOSIS — E782 Mixed hyperlipidemia: Secondary | ICD-10-CM

## 2024-02-25 DIAGNOSIS — I10 Essential (primary) hypertension: Secondary | ICD-10-CM

## 2024-02-25 DIAGNOSIS — E1165 Type 2 diabetes mellitus with hyperglycemia: Secondary | ICD-10-CM

## 2024-02-25 DIAGNOSIS — R748 Abnormal levels of other serum enzymes: Secondary | ICD-10-CM

## 2024-02-25 LAB — CBC WITH DIFFERENTIAL/PLATELET
Absolute Lymphocytes: 1534 {cells}/uL (ref 850–3900)
Absolute Monocytes: 302 {cells}/uL (ref 200–950)
Basophils Absolute: 11 {cells}/uL (ref 0–200)
Basophils Relative: 0.3 %
Eosinophils Absolute: 50 {cells}/uL (ref 15–500)
Eosinophils Relative: 1.4 %
HCT: 41.2 % (ref 39.4–51.1)
Hemoglobin: 14.2 g/dL (ref 13.2–17.1)
MCH: 30.9 pg (ref 27.0–33.0)
MCHC: 34.5 g/dL (ref 31.6–35.4)
MCV: 89.8 fL (ref 81.4–101.7)
MPV: 10.8 fL (ref 7.5–12.5)
Monocytes Relative: 8.4 %
Neutro Abs: 1703 {cells}/uL (ref 1500–7800)
Neutrophils Relative %: 47.3 %
Platelets: 319 10*3/uL (ref 140–400)
RBC: 4.59 Million/uL (ref 4.20–5.80)
RDW: 12.2 % (ref 11.0–15.0)
Total Lymphocyte: 42.6 %
WBC: 3.6 10*3/uL — ABNORMAL LOW (ref 3.8–10.8)

## 2024-02-25 MED ORDER — ATORVASTATIN CALCIUM 20 MG PO TABS: 20.0000 mg | ORAL_TABLET | Freq: Every day | ORAL | 1 refills | Status: AC

## 2024-02-25 MED ORDER — SILDENAFIL CITRATE 100 MG PO TABS: 50.0000 mg | ORAL_TABLET | Freq: Every day | ORAL | 11 refills | Status: AC | PRN

## 2024-02-25 MED ORDER — AMLODIPINE BESYLATE 5 MG PO TABS: 5.0000 mg | ORAL_TABLET | Freq: Every day | ORAL | 1 refills | Status: AC

## 2024-02-25 MED ORDER — OZEMPIC (2 MG/DOSE) 8 MG/3ML ~~LOC~~ SOPN: 2.0000 mg | PEN_INJECTOR | SUBCUTANEOUS | 1 refills | Status: AC

## 2024-02-25 NOTE — Progress Notes (Signed)
 "  BP 124/88   Pulse 83   Temp 98.5 F (36.9 C)   Ht 5' 11 (1.803 m)   Wt 223 lb (101.2 kg)   SpO2 96%   BMI 31.10 kg/m    Subjective:    Patient ID: Eric Mitchell, male    DOB: Apr 22, 1975, 49 y.o.   MRN: [id number]  HPI: Eric Mitchell is a 49 y.o. male  Chief Complaint  Patient presents with   Medical Management of Chronic Issues    Would like to discuss erectile dysfunction medication.    Discussed the use of AI scribe software for clinical note transcription with the patient, who gave verbal consent to proceed.  History of Present Illness Eric Mitchell is a 49 year old male with erectile dysfunction who presents for a three month follow-up.  Erectile dysfunction - Difficulty achieving and maintaining an erection - No chest pain during sexual activity  Glycemic control - Type 2 diabetes mellitus - Home blood glucose readings: 103 and 124 mg/dL - Last hemoglobin J8r: 6.7% - Current regimen: Lantus  10 units daily, Ozempic  2 mg weekly - Tolerates Ozempic  without adverse effects  Hypertension - Hypertension managed with amlodipine  5 mg daily and losartan  100 mg daily  Obesity - Obesity, ongoing management with current medication regimen Wt Readings from Last 3 Encounters:  02/25/24 223 lb (101.2 kg)  11/25/23 223 lb (101.2 kg)  08/25/23 229 lb 3.2 oz (104 kg)   Body mass index is 31.1 kg/m.  Flowsheet Row Office Visit from 02/25/2024 in Eric [facility] Office Visit from 11/25/2023 in Eric [facility] Office Visit from 08/25/2023 in Eric [facility]  1 38 inches 38 inches 42.5 inches     Hyperlipidemia - Hyperlipidemia managed with atorvastatin  20 mg daily - Recent lipid panel within normal limits  Microalbuminuria - Microalbuminuria, ongoing monitoring  Laboratory abnormalities - Previous labs: slightly low white blood cell count (3.5) - Elevated ALT (58)         08/25/2023    8:00 AM  11/26/2022    7:44 AM 08/26/2022    7:52 AM  Depression screen PHQ 2/9  Decreased Interest 0 0 0  Down, Depressed, Hopeless 0 0 0  PHQ - 2 Score 0 0 0    Relevant past medical, surgical, family and social history reviewed and updated as indicated. Interim medical history since our last visit reviewed. Allergies and medications reviewed and updated.  Review of Systems  Constitutional: Negative for fever or weight change.  Respiratory: Negative for cough and shortness of breath.   Cardiovascular: Negative for chest pain or palpitations.  Gastrointestinal: Negative for abdominal pain, no bowel changes.  Musculoskeletal: Negative for gait problem or joint swelling.  Skin: Negative for rash.  Neurological: Negative for dizziness or headache.  No other specific complaints in a complete review of systems (except as listed in HPI above).      Objective:      BP 124/88   Pulse 83   Temp 98.5 F (36.9 C)   Ht 5' 11 (1.803 m)   Wt 223 lb (101.2 kg)   SpO2 96%   BMI 31.10 kg/m    Wt Readings from Last 3 Encounters:  02/25/24 223 lb (101.2 kg)  11/25/23 223 lb (101.2 kg)  08/25/23 229 lb 3.2 oz (104 kg)    Physical Exam VITALS: BP- 124/88 GENERAL: Alert, cooperative, well developed, no acute distress. HEENT: Normocephalic, normal oropharynx, moist mucous membranes. CHEST: Clear  to auscultation bilaterally, no wheezes, rhonchi, or crackles. CARDIOVASCULAR: Normal heart rate and rhythm, S1 and S2 normal without murmurs. ABDOMEN: Soft, non-tender, non-distended, without organomegaly, normal bowel sounds. EXTREMITIES: No cyanosis or edema. NEUROLOGICAL: Cranial nerves grossly intact, moves all extremities without gross motor or sensory deficit.  Results for orders placed or performed in visit on 11/25/23  CBC with Differential/Platelet   Collection Time: 11/25/23  8:42 AM  Result Value Ref Range   WBC 3.5 (L) 3.8 - 10.8 Thousand/uL   RBC 4.80 4.20 - 5.80 Million/uL   Hemoglobin  15.3 13.2 - 17.1 g/dL   HCT 56.3 61.4 - 49.9 %   MCV 90.8 80.0 - 100.0 fL   MCH 31.9 27.0 - 33.0 pg   MCHC 35.1 32.0 - 36.0 g/dL   RDW 87.4 88.9 - 84.9 %   Platelets 276 140 - 400 Thousand/uL   MPV 10.5 7.5 - 12.5 fL   Neutro Abs 1,330 (L) 1,500 - 7,800 cells/uL   Absolute Lymphocytes 1,799 850 - 3,900 cells/uL   Absolute Monocytes 322 200 - 950 cells/uL   Eosinophils Absolute 39 15 - 500 cells/uL   Basophils Absolute 11 0 - 200 cells/uL   Neutrophils Relative % 38 %   Total Lymphocyte 51.4 %   Monocytes Relative 9.2 %   Eosinophils Relative 1.1 %   Basophils Relative 0.3 %  Comprehensive metabolic panel with GFR   Collection Time: 11/25/23  8:42 AM  Result Value Ref Range   Glucose, Bld 99 65 - 139 mg/dL   BUN 12 7 - 25 mg/dL   Creat 9.03 9.29 - 8.69 mg/dL   eGFR 96 > OR = 60 fO/fpw/8.26f7   BUN/Creatinine Ratio SEE NOTE: 6 - 22 (calc)   Sodium 140 135 - 146 mmol/L   Potassium 5.0 3.5 - 5.3 mmol/L   Chloride 104 98 - 110 mmol/L   CO2 30 20 - 32 mmol/L   Calcium  9.6 8.6 - 10.3 mg/dL   Total Protein 6.9 6.1 - 8.1 g/dL   Albumin 4.6 3.6 - 5.1 g/dL   Globulin 2.3 1.9 - 3.7 g/dL (calc)   AG Ratio 2.0 1.0 - 2.5 (calc)   Total Bilirubin 0.4 0.2 - 1.2 mg/dL   Alkaline phosphatase (APISO) 58 35 - 144 U/L   AST 27 10 - 35 U/L   ALT 58 (H) 9 - 46 U/L  Lipid panel   Collection Time: 11/25/23  8:42 AM  Result Value Ref Range   Cholesterol 111 <200 mg/dL   HDL 48 > OR = 40 mg/dL   Triglycerides 52 <849 mg/dL   LDL Cholesterol (Calc) 50 mg/dL (calc)   Total CHOL/HDL Ratio 2.3 <5.0 (calc)   Non-HDL Cholesterol (Calc) 63 <869 mg/dL (calc)  Hemoglobin J8r   Collection Time: 11/25/23  8:42 AM  Result Value Ref Range   Hgb A1c MFr Bld 6.7 (H) <5.7 %   Mean Plasma Glucose 146 mg/dL   eAG (mmol/L) 8.1 mmol/L  TSH   Collection Time: 11/25/23  8:42 AM  Result Value Ref Range   TSH 1.17 0.40 - 4.50 mIU/L          Assessment & Plan:   Problem List Items Addressed This Visit        Cardiovascular and Mediastinum   Essential hypertension - Primary   Relevant Medications   amLODipine  (NORVASC ) 5 MG tablet   atorvastatin  (LIPITOR) 20 MG tablet   sildenafil  (VIAGRA ) 100 MG tablet     Endocrine  Type 2 diabetes mellitus with hyperglycemia, without long-term current use of insulin  (HCC)   Relevant Medications   Semaglutide , 2 MG/DOSE, (OZEMPIC , 2 MG/DOSE,) 8 MG/3ML SOPN   atorvastatin  (LIPITOR) 20 MG tablet   Other Relevant Orders   Comprehensive metabolic panel with GFR   Hemoglobin A1c     Other   Class 1 obesity due to excess calories with serious comorbidity and body mass index (BMI) of 31.0 to 31.9 in adult   Relevant Medications   Semaglutide , 2 MG/DOSE, (OZEMPIC , 2 MG/DOSE,) 8 MG/3ML SOPN   Mixed hyperlipidemia   Relevant Medications   amLODipine  (NORVASC ) 5 MG tablet   atorvastatin  (LIPITOR) 20 MG tablet   sildenafil  (VIAGRA ) 100 MG tablet   Microalbuminuria   Other Visit Diagnoses       Elevated liver enzymes       Relevant Orders   Comprehensive metabolic panel with GFR     Leukopenia, unspecified type       Relevant Orders   CBC with Differential/Platelet     Immunization due       Relevant Orders   Pneumococcal conjugate vaccine 20-valent (Prevnar 20)     Erectile dysfunction, unspecified erectile dysfunction type       Relevant Medications   sildenafil  (VIAGRA ) 100 MG tablet        Assessment and Plan Assessment & Plan Essential hypertension Blood pressure is well-controlled at 124/88 mmHg. - Continue current antihypertensive regimen.amlodipine  5 mg daily and losartan  100 mg daily  Type 2 diabetes mellitus with diabetic nephropathy Last A1c was 6.7, indicating good glycemic control. Blood sugar levels at home range from 103 to 124 mg/dL. No adverse effects from Ozempic  reported. - Continue current diabetes management regimen. - Current regimen: Lantus  10 units daily, Ozempic  2 mg weekly  Mixed hyperlipidemia Lipid panel  was normal. - Continue atorvastatin  20 mg daily.  Class 1 obesity -on ozempic  - Encourage continuation of lifestyle modifications, including dietary management and regular exercise. -continue to increase physical activity, getting at least 150 min of physical activity a week.  Work on including runner, broadcasting/film/video 2 days a week.  - continue eating at a calorie deficit 2000-2200 cal a day, eating a well balanced diet with whole foods, avoiding processed foods.   Patient is motivated to continue working on lifestyle modification.    Erectile dysfunction Reports difficulty with both initiation and maintenance of erection. No associated chest pain during sexual activity. Insurance coverage for Viagra  is uncertain, but GoodRx coupon may reduce cost. - Prescribed Viagra , instructed to take half to full tablet an hour before sexual activity. - Advised to use GoodRx coupon for cost reduction. - Instructed to send MyChart message if cost is prohibitive.  Leukopenia Previous white blood count was slightly low at 3.5. Re-evaluation needed to ensure stability. - Rechecked white blood count.  Elevated liver enzymes Previous ALT was slightly elevated at 58. Re-evaluation needed to ensure stability. - Rechecked liver enzymes.        Follow up plan: Return in about 4 months (around 06/24/2024) for follow up. "

## 2024-06-26 ENCOUNTER — Ambulatory Visit: Admitting: Nurse Practitioner
